# Patient Record
Sex: Female | Born: 1940 | Race: White | Hispanic: No | Smoking: Former smoker
Health system: Southern US, Community
[De-identification: ages and names within clinical notes are randomized; demographics above are authoritative.]

## PROBLEM LIST (undated history)

## (undated) DIAGNOSIS — K219 Gastro-esophageal reflux disease without esophagitis: Secondary | ICD-10-CM

## (undated) DIAGNOSIS — F419 Anxiety disorder, unspecified: Secondary | ICD-10-CM

## (undated) DIAGNOSIS — I10 Essential (primary) hypertension: Secondary | ICD-10-CM

## (undated) DIAGNOSIS — J45909 Unspecified asthma, uncomplicated: Secondary | ICD-10-CM

## (undated) DIAGNOSIS — R001 Bradycardia, unspecified: Secondary | ICD-10-CM

## (undated) DIAGNOSIS — I471 Supraventricular tachycardia: Secondary | ICD-10-CM

## (undated) DIAGNOSIS — J9601 Acute respiratory failure with hypoxia: Secondary | ICD-10-CM

## (undated) DIAGNOSIS — I6200 Nontraumatic subdural hemorrhage, unspecified: Secondary | ICD-10-CM

## (undated) DIAGNOSIS — I495 Sick sinus syndrome: Secondary | ICD-10-CM

## (undated) DIAGNOSIS — E7800 Pure hypercholesterolemia, unspecified: Secondary | ICD-10-CM

## (undated) DIAGNOSIS — R42 Dizziness and giddiness: Secondary | ICD-10-CM

## (undated) DIAGNOSIS — M1990 Unspecified osteoarthritis, unspecified site: Secondary | ICD-10-CM

## (undated) HISTORY — PX: PACEMAKER INSERTION: SHX728

## (undated) HISTORY — PX: CHOLECYSTECTOMY: SHX55

---

## 2020-12-11 ENCOUNTER — Emergency Department (HOSPITAL_BASED_OUTPATIENT_CLINIC_OR_DEPARTMENT_OTHER): Payer: Medicare HMO

## 2020-12-11 ENCOUNTER — Encounter (HOSPITAL_BASED_OUTPATIENT_CLINIC_OR_DEPARTMENT_OTHER): Payer: Self-pay | Admitting: Emergency Medicine

## 2020-12-11 ENCOUNTER — Other Ambulatory Visit: Payer: Self-pay

## 2020-12-11 ENCOUNTER — Emergency Department (HOSPITAL_BASED_OUTPATIENT_CLINIC_OR_DEPARTMENT_OTHER)
Admission: EM | Admit: 2020-12-11 | Discharge: 2020-12-11 | Disposition: A | Discharge status: Home / Self Care | Payer: Medicare HMO | Attending: Emergency Medicine | Admitting: Emergency Medicine

## 2020-12-11 DIAGNOSIS — S42212A Unspecified displaced fracture of surgical neck of left humerus, initial encounter for closed fracture: Secondary | ICD-10-CM | POA: Insufficient documentation

## 2020-12-11 DIAGNOSIS — W19XXXA Unspecified fall, initial encounter: Secondary | ICD-10-CM

## 2020-12-11 DIAGNOSIS — I10 Essential (primary) hypertension: Secondary | ICD-10-CM | POA: Insufficient documentation

## 2020-12-11 DIAGNOSIS — J45909 Unspecified asthma, uncomplicated: Secondary | ICD-10-CM | POA: Diagnosis not present

## 2020-12-11 DIAGNOSIS — S4992XA Unspecified injury of left shoulder and upper arm, initial encounter: Secondary | ICD-10-CM | POA: Diagnosis present

## 2020-12-11 DIAGNOSIS — W01198A Fall on same level from slipping, tripping and stumbling with subsequent striking against other object, initial encounter: Secondary | ICD-10-CM | POA: Insufficient documentation

## 2020-12-11 DIAGNOSIS — S42291A Other displaced fracture of upper end of right humerus, initial encounter for closed fracture: Secondary | ICD-10-CM

## 2020-12-11 DIAGNOSIS — Z950 Presence of cardiac pacemaker: Secondary | ICD-10-CM | POA: Insufficient documentation

## 2020-12-11 HISTORY — DX: Essential (primary) hypertension: I10

## 2020-12-11 HISTORY — DX: Unspecified asthma, uncomplicated: J45.909

## 2020-12-11 HISTORY — DX: Pure hypercholesterolemia, unspecified: E78.00

## 2020-12-11 MED ORDER — TRAMADOL HCL 50 MG PO TABS: 50.0000 mg | ORAL_TABLET | Freq: Four times a day (QID) | ORAL | 0 refills | Status: DC | PRN

## 2020-12-11 MED ORDER — TRAMADOL HCL 50 MG PO TABS
50.0000 mg | ORAL_TABLET | Freq: Once | ORAL | Status: AC
  Administered 2020-12-11: 50 mg via ORAL
  Filled 2020-12-11: qty 1

## 2020-12-11 NOTE — Discharge Instructions (Signed)
Your imaging today confirmed a right humerus head and neck fracture.  I spoke to the orthopedic team and they requested you stay in the sling, use the pain medicine, and follow-up with them in clinic this week.  Please call either the shoulder surgeons above to schedule an appointment.  Please rest.  If any symptoms change or worsen, please return to the nearest emergency department.

## 2020-12-11 NOTE — ED Provider Notes (Signed)
[facility] EMERGENCY DEPARTMENT Provider Note   CSN: [id number] Arrival date & time: 12/11/20  1341     History Chief Complaint  Patient presents with  . Fall    Holly Sherman is a 80 y.o. female.  The history is provided by the patient, medical records and a relative. No language interpreter was used.  Fall This is a new problem. The current episode started yesterday. The problem occurs rarely. The problem has not changed since onset.Pertinent negatives include no chest pain, no abdominal pain, no headaches and no shortness of breath.  Shoulder Injury This is a new problem. The current episode started yesterday. The problem has not changed since onset.Pertinent negatives include no chest pain, no abdominal pain, no headaches and no shortness of breath. The symptoms are aggravated by twisting (ranging shoulder). Nothing relieves the symptoms. She has tried nothing for the symptoms. The treatment provided no relief.       Past Medical History:  Diagnosis Date  . Asthma   . High cholesterol   . Hypertension     There are no problems to display for this patient.   Past Surgical History:  Procedure Laterality Date  . PACEMAKER INSERTION       OB History   No obstetric history on file.     No family history on file.  Social History   Tobacco Use  . Smoking status: Never Smoker  . Smokeless tobacco: Never Used    Home Medications Prior to Admission medications   Not on File    Allergies    Codeine  Review of Systems   Review of Systems  Constitutional: Negative for chills, diaphoresis, fatigue and fever.  HENT: Negative for congestion.   Respiratory: Negative for cough, chest tightness, shortness of breath and wheezing.   Cardiovascular: Negative for chest pain and palpitations.  Gastrointestinal: Negative for abdominal pain and nausea.  Genitourinary: Negative for flank pain.  Musculoskeletal: Negative for back pain, neck pain and neck  stiffness.  Neurological: Negative for syncope, weakness, light-headedness, numbness and headaches.  Psychiatric/Behavioral: Negative for agitation.  All other systems reviewed and are negative.   Physical Exam Updated Vital Signs BP (!) 175/108 (BP Location: Left Arm)   Pulse (!) 59   Temp 97.8 F (36.6 C) (Oral)   Resp 18   Ht 5\' 2"  (1.575 m)   Wt 71.2 kg   SpO2 98%   BMI 28.72 kg/m   Physical Exam Vitals and nursing note reviewed.  Constitutional:      General: She is not in acute distress.    Appearance: She is well-developed and well-nourished. She is not ill-appearing, toxic-appearing or diaphoretic.  HENT:     Head: Normocephalic and atraumatic.  Eyes:     Conjunctiva/sclera: Conjunctivae normal.     Pupils: Pupils are equal, round, and reactive to light.  Cardiovascular:     Rate and Rhythm: Normal rate and regular rhythm.     Heart sounds: No murmur heard.   Pulmonary:     Effort: Pulmonary effort is normal. No respiratory distress.     Breath sounds: Normal breath sounds. No wheezing, rhonchi or rales.  Chest:     Chest wall: No tenderness.  Abdominal:     General: Abdomen is flat.     Palpations: Abdomen is soft.     Tenderness: There is no abdominal tenderness.  Musculoskeletal:        General: Tenderness and signs of injury present. No edema.  Right shoulder: Tenderness and bony tenderness present. Decreased range of motion.     Cervical back: Neck supple. No tenderness.     Comments: Right shoulder range of motion limited due to severe pain.  Intact grip strength, sensation, pulses, appearance distal to the injury.  No tenderness or pain in the elbow or wrist  No tenderness on the chest wall or scapular area   Skin:    General: Skin is warm and dry.     Capillary Refill: Capillary refill takes less than 2 seconds.     Coloration: Skin is not pale.     Findings: No erythema.  Neurological:     General: No focal deficit present.     Mental  Status: She is alert.     Sensory: No sensory deficit.     Motor: No weakness.  Psychiatric:        Mood and Affect: Mood and affect normal.     ED Results / Procedures / Treatments   Labs (all labs ordered are listed, but only abnormal results are displayed) Labs Reviewed - No data to display  EKG None  Radiology DG Shoulder Right  Result Date: 12/11/2020 CLINICAL DATA:  Right shoulder pain after fall yesterday EXAM: RIGHT SHOULDER - 2+ VIEW COMPARISON:  None. FINDINGS: Severely comminuted fracture is seen involving the proximal right humeral head and neck. Ribs are unremarkable. IMPRESSION: Severely comminuted proximal right humeral head and neck fracture. Electronically Signed   By: Lupita Raider M.D.   On: 12/11/2020 14:22   CT Shoulder Right Wo Contrast  Result Date: 12/11/2020 CLINICAL DATA:  Humeral fracture, scapular fracture. Right shoulder pain. EXAM: CT OF THE UPPER RIGHT EXTREMITY WITHOUT CONTRAST TECHNIQUE: Multidetector CT imaging of the upper right extremity was performed according to the standard protocol. COMPARISON:  None. FINDINGS: Bones/Joint/Cartilage Comminuted, impacted fracture of the surgical neck of the right proximal humerus with 5 mm of lateral displacement and 9 mm of anterior displacement. Comminuted fracture of the greater tuberosity with the major fracture fragment displaced posteriorly by 10 mm. Fracture cleft extends into the lesser tuberosity. Nondisplaced fracture cleft involving the articular surface of the humeral head. No other fracture or dislocation. Large glenohumeral hemarthrosis. No aggressive osseous lesion. Partially visualized cervical spine degenerative disease with disc height loss. Ligaments Ligaments are suboptimally evaluated by CT. Muscles and Tendons Muscles are normal.  No muscle atrophy. Soft tissue No fluid collection or hematoma. No soft tissue mass. Visualized right lung is clear. Soft tissue edema around the glenohumeral joint. Soft  tissue edema in the subcutaneous fat around the shoulder. IMPRESSION: 1. Comminuted, impacted fracture of the surgical neck of the right proximal humerus with 5 mm of lateral displacement and 9 mm of anterior displacement. Comminuted fracture of the greater tuberosity with the major fracture fragment displaced posteriorly by 10 mm. Fracture cleft extends into the lesser tuberosity. Nondisplaced fracture cleft involving the articular surface of the humeral head. 2. Large glenohumeral hemarthrosis. Electronically Signed   By: Elige Ko   On: 12/11/2020 16:57    Procedures Procedures   Medications Ordered in ED Medications  traMADol (ULTRAM) tablet 50 mg (50 mg Oral Given 12/11/20 1540)    ED Course  I have reviewed the triage vital signs and the nursing notes.  Pertinent labs & imaging results that were available during my care of the patient were reviewed by me and considered in my medical decision making (see chart for details).    MDM Rules/Calculators/A&P  Consuello Underhill is a 80 y.o. female with a past medical history significant for pacemaker, hypertension, high cholesterol, asthma, and previous left shoulder fracture who presents with fall and right shoulder injury.  Patient reports that she was getting out of a car tod yesterday ay when she stepped on some mud and slipped and fell directly onto her right shoulder.  She reports that she tried to use some over-the-counter medications help the pain but it was significant.  She is concerned she broke her right shoulder similarly how she broke her left shoulder in the past.  She reports initially it was tingly immediately after the fall but has had no numbness, tingling, or weakness today.  She reports the pain is moderate to severe and worsened with any movement or manipulation.  She denies elbow pain or wrist pain.  She denies any pain in her chest, head, neck, back, or legs.  On exam, patient does have tenderness in  her right shoulder.  She has intact sensation in the deltoid area of her arm as well as the rest of the arm.  Normal sensation in her hand.  Normal grip strength.  Normal pulses.  Normal cap refill.  Patient has pain with any right shoulder manipulation.  Lungs clear and chest nontender.  Neck nontender, abdomen nontender.  Exam is unremarkable.  Patient had x-ray in triage which shows a comminuted humeral head and neck fracture on the right.  No evidence of rib fractures underlying on the image.  I called orthopedics who recommended CT scan, sling, pain medication, and follow-up in clinic this week to either Dr. Aundria Rud, Dr. Rennis Chris, or Dr. Devonne Doughty.  We will place the patient in a sling, give her prescription for tramadol which the family requested for her, and get her discharge for follow-up after imaging is collected.  Patient agrees with this plan and anticipate discharge after imaging  Imaging was performed and patient was placed in a sling.  Patient tolerated that well.  Patient given prescription for Ultram which she has tolerated and will be discharged to follow-up with the orthopedics team.  Patient discharged in good condition.   Final Clinical Impression(s) / ED Diagnoses Final diagnoses:  Closed fracture of head of right humerus, initial encounter  Fall, initial encounter    Rx / DC Orders ED Discharge Orders         Ordered    traMADol (ULTRAM) 50 MG tablet  Every 6 hours PRN        12/11/20 1653          Clinical Impression: 1. Closed fracture of head of right humerus, initial encounter   2. Fall, initial encounter     Disposition: Discharge  Condition: Good  I have discussed the results, Dx and Tx plan with the pt(& family if present). He/she/they expressed understanding and agree(s) with the plan. Discharge instructions discussed at great length. Strict return precautions discussed and pt &/or family have verbalized understanding of the instructions. No further  questions at time of discharge.    New Prescriptions   TRAMADOL (ULTRAM) 50 MG TABLET    Take 1 tablet (50 mg total) by mouth every 6 (six) hours as needed.    Follow Up: Yolonda Kida, MD [address] [place] [address] [place] [address] [phone]     Francena Hanly, MD [address] [place] [address] [place] [address] [phone]     Beverely Low, MD [address] [place] [address] [place] [address] [phone]  [facility] EMERGENCY DEPARTMENT [address] [id number] mc [address] [place] [address] [phone]       Floyde Dingley, Canary Brim, MD 12/11/20 1710

## 2020-12-11 NOTE — ED Triage Notes (Signed)
Pt slipped on mud yesterday landing on her R arm. C/o R shoulder pain. Denies LOC, neck or back pain.

## 2020-12-14 ENCOUNTER — Encounter (HOSPITAL_COMMUNITY): Payer: Self-pay | Admitting: Orthopedic Surgery

## 2020-12-14 ENCOUNTER — Other Ambulatory Visit: Payer: Self-pay

## 2020-12-14 NOTE — Progress Notes (Addendum)
COVID Vaccine Completed: No Date COVID Vaccine completed: COVID vaccine manufacturer: Pfizer    [employer]   Date of COVID positive in last 90 days: N/A.  Had Covid one year ago  PCP - Ardyth Gal, MD Cardiologist - Sandy Salaam, MD.  Sees in [facility]  Chest x-ray - 09-01-20 Care Everywhere EKG - 07-15-20 Care Everywhere.  Req 12-14-20 Stress Test -  ECHO - 05-14-20 Care Everywhere Cardiac Cath -  Pacemaker/ICD device last checked: 11-18-20 Care Everywhere Heart Monitor - 05-20-20 Care Everywhere  Sleep Study - N/A CPAP -   Fasting Blood Sugar - N/A Checks Blood Sugar _____ times a day  Blood Thinner Instructions:  N/A Aspirin Instructions: Last Dose:  Activity level:  Unable to go up a flight of stairs without symptoms of shortness of breath.   Has improved since pacemaker insertion.  Patient is able to do housework and still drives.       Anesthesia review:  Sick sinus syndrome with pacemaker placement, SVT, hx of respiratory failure  Patient has some shortness of breath with exertion but improved since pacemaker placement.  Denies fever, cough and chest pain.   Patient verbalized understanding of instructions that were given to them at the PAT appointment. Patient was also instructed that they will need to review over the PAT instructions again at home before surgery.

## 2020-12-14 NOTE — Progress Notes (Signed)
Please enter orders for surgery 12-16-20.

## 2020-12-15 NOTE — Progress Notes (Signed)
EKG 07/15/20 received placed in chart.

## 2020-12-15 NOTE — H&P (Signed)
Patient's anticipated LOS is less than 2 midnights, meeting these requirements: - Younger than 62 - Lives within 1 hour of care - Has a competent adult at home to recover with post-op recover - NO history of  - Chronic pain requiring opiods  - Diabetes  - Coronary Artery Disease  - Heart failure  - Heart attack  - Stroke  - DVT/VTE  - Cardiac arrhythmia  - Respiratory Failure/COPD  - Renal failure  - Anemia  - Advanced Liver disease       Violanda Dembeck is an 80 y.o. female.    Chief Complaint: right shoulder pain  HPI: Pt is a 80 y.o. female complaining of right shoulder pain due to recent fall. Pain had continually increased since the beginning. X-rays in the clinic show proximal humerus fracture with head split.  Various options are discussed with the patient. Risks, benefits and expectations were discussed with the patient. Patient understand the risks, benefits and expectations and wishes to proceed with surgery.   PCP:  System, Provider Not In  D/C Plans: Home  PMH: Past Medical History:  Diagnosis Date  . Acute respiratory failure with hypoxemia (HCC)   . Anxiety   . Arthritis   . Asthma   . Bradycardia   . GERD (gastroesophageal reflux disease)   . High cholesterol   . Hypertension   . Paroxysmal supraventricular tachycardia (HCC)   . Sick sinus syndrome (HCC)   . Subdural hemorrhage (HCC)   . Vertigo     PSH: Past Surgical History:  Procedure Laterality Date  . CHOLECYSTECTOMY    . PACEMAKER INSERTION      Social History:  reports that she has quit smoking. She has never used smokeless tobacco. She reports that she does not drink alcohol and does not use drugs.  Allergies:  Allergies  Allergen Reactions  . Atorvastatin Other (See Comments)    Muscle pain/ patient is taking  . Codeine Other (See Comments)    GI- upset  . Colesevelam Nausea And Vomiting  . Hydrochlorothiazide W-Amiloride   . Rosuvastatin Other (See Comments)    Other  reaction(s): MYALGIA,HIP WEAKNESS Myalgia, Hip Weakness.   . Moxifloxacin Rash    Medications: No current facility-administered medications for this encounter.   Current Outpatient Medications  Medication Sig Dispense Refill  . albuterol (VENTOLIN HFA) 108 (90 Base) MCG/ACT inhaler Inhale 2 puffs into the lungs every 6 (six) hours as needed for wheezing or shortness of breath.    Marland Kitchen amLODipine (NORVASC) 5 MG tablet Take 5 mg by mouth daily.    Marland Kitchen atorvastatin (LIPITOR) 20 MG tablet Take 20 mg by mouth at bedtime.    . cyclobenzaprine (FLEXERIL) 5 MG tablet Take 5 mg by mouth at bedtime as needed for muscle spasms.    . ergocalciferol (VITAMIN D2) 1.25 MG (50000 UT) capsule Take 50,000 Units by mouth once a week.    . escitalopram (LEXAPRO) 10 MG tablet Take 10 mg by mouth daily.    . furosemide (LASIX) 20 MG tablet Take 20 mg by mouth daily as needed for edema.    Marland Kitchen losartan (COZAAR) 100 MG tablet Take 100 mg by mouth daily.    . meclizine (ANTIVERT) 25 MG tablet Take 25 mg by mouth 3 (three) times daily as needed for dizziness.    . metoprolol succinate (TOPROL-XL) 25 MG 24 hr tablet Take 25 mg by mouth daily.    . montelukast (SINGULAIR) 10 MG tablet Take 10 mg by mouth daily  as needed (allergies).    . mupirocin ointment (BACTROBAN) 2 % Apply 1 application topically daily as needed (Rash).    Marland Kitchen omeprazole (PRILOSEC) 20 MG capsule Take 20 mg by mouth daily.    Bertram Gala Glycol-Propyl Glycol (SYSTANE) 0.4-0.3 % SOLN Place 1 drop into both eyes daily as needed (Dry eyes).    . polyethylene glycol (MIRALAX / GLYCOLAX) 17 g packet Take 17 g by mouth daily as needed.    . potassium chloride (KLOR-CON) 10 MEQ tablet Take 10 mEq by mouth See admin instructions.    . traMADol (ULTRAM) 50 MG tablet Take 1 tablet (50 mg total) by mouth every 6 (six) hours as needed. (Patient taking differently: Take 50 mg by mouth every 6 (six) hours as needed for severe pain or moderate pain.) 15 tablet 0     No results found for this or any previous visit (from the past 48 hour(s)). No results found.  ROS: Pain with rom of the right upper extremity  Physical Exam: Alert and oriented 80 y.o. female in no acute distress Cranial nerves 2-12 intact Cervical spine: full rom with no tenderness, nv intact distally Chest: active breath sounds bilaterally, no wheeze rhonchi or rales Heart: regular rate and rhythm, no murmur Abd: non tender non distended with active bowel sounds Hip is stable with rom  Right shoulder painful rom and early ecchymosis and edema nv intact distally No signs of open injury  Assessment/Plan Assessment: right proximal humerus fracture  Plan:  Patient will undergo a right reverse total shoulder by Dr. Ranell Patrick at [facility] Risks benefits and expectations were discussed with the patient. Patient understand risks, benefits and expectations and wishes to proceed. Preoperative templating of the joint replacement has been completed, documented, and submitted to the Operating Room personnel in order to optimize intra-operative equipment management.   Alphonsa Overall PA-C, MPAS [facility] is now [employer] [address], [place], [place] [address] Phone: [phone] [email or web address] Facebook  [employer]

## 2020-12-15 NOTE — Progress Notes (Signed)
Anesthesia Chart Review   Case: [id number] Date/Time: 12/16/20 1453   Procedure: REVERSE SHOULDER ARTHROPLASTY (Right Shoulder) - with interscalene block   Anesthesia type: General   Pre-op diagnosis: Right proximal humerus fracture   Location: [facility] ROOM 06 / [facility]   Surgeons: Beverely Low, MD      DISCUSSION:79 y.o. former smoker with h/o HTN, GERD, sick sinus syndrome s/p dual-chamber Biotronik pacemaker implant 09/01/2020, paroxysmal supraventricular tachycardia, right proximal humerus fracture scheduled for above procedure 12/16/20 with Dr. Beverely Low.   Pt last seen by cardiology 11/18/2020. Stable pacemaker interrogation. Toprol added due to some episodes of tachycardia. 7 month f/u recommended.   Device orders requested, pending.  VS: Ht 5\' 2"  (1.575 m)   Wt 71.2 kg   BMI 28.72 kg/m   PROVIDERS: System, Provider Not In  , MD is PCP  Tessie Eke, MD is Cardiologist  LABS: labs DOS, SDW (all labs ordered are listed, but only abnormal results are displayed)  Labs Reviewed - No data to display   IMAGES:   EKG: On chart   CV: Echo 05/14/2020  SUMMARY  The left ventricle is hyperdynamic.  LV ejection fraction = >70%.  No segmental wall motion abnormalities seen in the left ventricle  Left ventricular filling pattern is indeterminate.  The right ventricular systolic function is normal.  There is no evidence of mitral valve prolapse.  There is no significantvalvular stenosis or regurgitation.  There is no comparison study available.   Past Medical History:  Diagnosis Date  . Acute respiratory failure with hypoxemia (HCC)   . Anxiety   . Arthritis   . Asthma   . Bradycardia   . GERD (gastroesophageal reflux disease)   . High cholesterol   . Hypertension   . Paroxysmal supraventricular tachycardia (HCC)   . Sick sinus syndrome (HCC)   . Subdural hemorrhage (HCC)   . Vertigo     Past Surgical History:  Procedure Laterality Date  .  CHOLECYSTECTOMY    . PACEMAKER INSERTION      MEDICATIONS: No current facility-administered medications for this encounter.   07/15/2020 albuterol (VENTOLIN HFA) 108 (90 Base) MCG/ACT inhaler  . amLODipine (NORVASC) 5 MG tablet  . atorvastatin (LIPITOR) 20 MG tablet  . cyclobenzaprine (FLEXERIL) 5 MG tablet  . ergocalciferol (VITAMIN D2) 1.25 MG (50000 UT) capsule  . escitalopram (LEXAPRO) 10 MG tablet  . furosemide (LASIX) 20 MG tablet  . losartan (COZAAR) 100 MG tablet  . meclizine (ANTIVERT) 25 MG tablet  . metoprolol succinate (TOPROL-XL) 25 MG 24 hr tablet  . montelukast (SINGULAIR) 10 MG tablet  . mupirocin ointment (BACTROBAN) 2 %  . omeprazole (PRILOSEC) 20 MG capsule  . Polyethyl Glycol-Propyl Glycol (SYSTANE) 0.4-0.3 % SOLN  . polyethylene glycol (MIRALAX / GLYCOLAX) 17 g packet  . potassium chloride (KLOR-CON) 10 MEQ tablet  . traMADol (ULTRAM) 50 MG tablet    Marland Kitchen, PA-C [facility] [phone]

## 2020-12-16 ENCOUNTER — Inpatient Hospital Stay (HOSPITAL_COMMUNITY)
Admission: RE | Admit: 2020-12-16 | Discharge: 2020-12-19 | DRG: 483 | Disposition: A | Discharge status: Home / Self Care | Payer: Medicare HMO | Attending: Orthopedic Surgery | Admitting: Orthopedic Surgery

## 2020-12-16 ENCOUNTER — Encounter (HOSPITAL_COMMUNITY): Payer: Self-pay | Admitting: Orthopedic Surgery

## 2020-12-16 ENCOUNTER — Encounter (HOSPITAL_COMMUNITY)
Admission: RE | Disposition: A | Discharge status: Home / Self Care | Payer: Self-pay | Source: Home / Self Care | Attending: Orthopedic Surgery

## 2020-12-16 ENCOUNTER — Inpatient Hospital Stay (HOSPITAL_COMMUNITY): Payer: Medicare HMO

## 2020-12-16 ENCOUNTER — Other Ambulatory Visit: Payer: Self-pay

## 2020-12-16 ENCOUNTER — Ambulatory Visit (HOSPITAL_COMMUNITY): Payer: Medicare HMO | Admitting: Physician Assistant

## 2020-12-16 DIAGNOSIS — W19XXXA Unspecified fall, initial encounter: Secondary | ICD-10-CM | POA: Diagnosis present

## 2020-12-16 DIAGNOSIS — Z87891 Personal history of nicotine dependence: Secondary | ICD-10-CM

## 2020-12-16 DIAGNOSIS — I471 Supraventricular tachycardia: Secondary | ICD-10-CM | POA: Diagnosis present

## 2020-12-16 DIAGNOSIS — Z950 Presence of cardiac pacemaker: Secondary | ICD-10-CM

## 2020-12-16 DIAGNOSIS — Z79899 Other long term (current) drug therapy: Secondary | ICD-10-CM

## 2020-12-16 DIAGNOSIS — Z20822 Contact with and (suspected) exposure to covid-19: Secondary | ICD-10-CM | POA: Diagnosis present

## 2020-12-16 DIAGNOSIS — S42291A Other displaced fracture of upper end of right humerus, initial encounter for closed fracture: Principal | ICD-10-CM | POA: Diagnosis present

## 2020-12-16 DIAGNOSIS — E7800 Pure hypercholesterolemia, unspecified: Secondary | ICD-10-CM | POA: Diagnosis present

## 2020-12-16 DIAGNOSIS — I495 Sick sinus syndrome: Secondary | ICD-10-CM | POA: Diagnosis present

## 2020-12-16 DIAGNOSIS — I10 Essential (primary) hypertension: Secondary | ICD-10-CM | POA: Diagnosis present

## 2020-12-16 DIAGNOSIS — Z888 Allergy status to other drugs, medicaments and biological substances status: Secondary | ICD-10-CM

## 2020-12-16 DIAGNOSIS — Z96611 Presence of right artificial shoulder joint: Secondary | ICD-10-CM

## 2020-12-16 DIAGNOSIS — R0602 Shortness of breath: Secondary | ICD-10-CM

## 2020-12-16 DIAGNOSIS — Z885 Allergy status to narcotic agent status: Secondary | ICD-10-CM

## 2020-12-16 DIAGNOSIS — K219 Gastro-esophageal reflux disease without esophagitis: Secondary | ICD-10-CM | POA: Diagnosis present

## 2020-12-16 DIAGNOSIS — Z881 Allergy status to other antibiotic agents status: Secondary | ICD-10-CM

## 2020-12-16 DIAGNOSIS — J45909 Unspecified asthma, uncomplicated: Secondary | ICD-10-CM | POA: Diagnosis present

## 2020-12-16 HISTORY — DX: Sick sinus syndrome: I49.5

## 2020-12-16 HISTORY — DX: Nontraumatic subdural hemorrhage, unspecified: I62.00

## 2020-12-16 HISTORY — DX: Dizziness and giddiness: R42

## 2020-12-16 HISTORY — DX: Supraventricular tachycardia: I47.1

## 2020-12-16 HISTORY — DX: Bradycardia, unspecified: R00.1

## 2020-12-16 HISTORY — DX: Acute respiratory failure with hypoxia: J96.01

## 2020-12-16 HISTORY — PX: REVERSE SHOULDER ARTHROPLASTY: SHX5054

## 2020-12-16 HISTORY — DX: Anxiety disorder, unspecified: F41.9

## 2020-12-16 HISTORY — DX: Gastro-esophageal reflux disease without esophagitis: K21.9

## 2020-12-16 HISTORY — DX: Unspecified osteoarthritis, unspecified site: M19.90

## 2020-12-16 LAB — CBC
HCT: 32.9 % — ABNORMAL LOW (ref 36.0–46.0)
Hemoglobin: 10.5 g/dL — ABNORMAL LOW (ref 12.0–15.0)
MCH: 31.9 pg (ref 26.0–34.0)
MCHC: 31.9 g/dL (ref 30.0–36.0)
MCV: 100 fL (ref 80.0–100.0)
Platelets: 275 10*3/uL (ref 150–400)
RBC: 3.29 MIL/uL — ABNORMAL LOW (ref 3.87–5.11)
RDW: 12.2 % (ref 11.5–15.5)
WBC: 7.3 10*3/uL (ref 4.0–10.5)
nRBC: 0 % (ref 0.0–0.2)

## 2020-12-16 LAB — SURGICAL PCR SCREEN
MRSA, PCR: NEGATIVE
Staphylococcus aureus: NEGATIVE

## 2020-12-16 LAB — BASIC METABOLIC PANEL
Anion gap: 12 (ref 5–15)
BUN: 9 mg/dL (ref 8–23)
CO2: 25 mmol/L (ref 22–32)
Calcium: 8.6 mg/dL — ABNORMAL LOW (ref 8.9–10.3)
Chloride: 101 mmol/L (ref 98–111)
Creatinine, Ser: 0.65 mg/dL (ref 0.44–1.00)
GFR, Estimated: >60 mL/min (ref >60)
Glucose, Bld: 117 mg/dL — ABNORMAL HIGH (ref 70–99)
Potassium: 3.9 mmol/L (ref 3.5–5.1)
Sodium: 138 mmol/L (ref 135–145)

## 2020-12-16 LAB — SARS CORONAVIRUS 2 BY RT PCR (HOSPITAL ORDER, PERFORMED IN ~~LOC~~ HOSPITAL LAB): SARS Coronavirus 2: NEGATIVE

## 2020-12-16 SURGERY — ARTHROPLASTY, SHOULDER, TOTAL, REVERSE
Anesthesia: General | Site: Shoulder | Laterality: Right

## 2020-12-16 MED ORDER — FENTANYL CITRATE (PF) 100 MCG/2ML IJ SOLN: 25.0000 ug | INTRAMUSCULAR | Status: DC | PRN

## 2020-12-16 MED ORDER — LIDOCAINE 2% (20 MG/ML) 5 ML SYRINGE
INTRAMUSCULAR | Status: DC | PRN
  Administered 2020-12-16: 80 mg via INTRAVENOUS

## 2020-12-16 MED ORDER — ESCITALOPRAM OXALATE 10 MG PO TABS
10.0000 mg | ORAL_TABLET | Freq: Every day | ORAL | Status: DC
  Administered 2020-12-16 – 2020-12-19 (×4): 10 mg via ORAL
  Filled 2020-12-16 (×4): qty 1

## 2020-12-16 MED ORDER — POTASSIUM CHLORIDE ER 10 MEQ PO TBCR
10.0000 meq | EXTENDED_RELEASE_TABLET | Freq: Every day | ORAL | Status: DC | PRN
  Filled 2020-12-16: qty 1

## 2020-12-16 MED ORDER — BUPIVACAINE HCL (PF) 0.5 % IJ SOLN
INTRAMUSCULAR | Status: DC | PRN
  Administered 2020-12-16: 10 mL via PERINEURAL

## 2020-12-16 MED ORDER — PROPOFOL 10 MG/ML IV BOLUS
INTRAVENOUS | Status: DC | PRN
  Administered 2020-12-16: 120 mg via INTRAVENOUS

## 2020-12-16 MED ORDER — POLYETHYLENE GLYCOL 3350 17 G PO PACK: 17.0000 g | PACK | Freq: Every day | ORAL | Status: DC | PRN

## 2020-12-16 MED ORDER — LACTATED RINGERS IV SOLN: INTRAVENOUS | Status: DC | PRN

## 2020-12-16 MED ORDER — BUPIVACAINE-EPINEPHRINE (PF) 0.25% -1:200000 IJ SOLN
INTRAMUSCULAR | Status: AC
  Filled 2020-12-16: qty 30

## 2020-12-16 MED ORDER — VASOPRESSIN 20 UNIT/ML IV SOLN
INTRAVENOUS | Status: AC
  Filled 2020-12-16: qty 1

## 2020-12-16 MED ORDER — EPHEDRINE 5 MG/ML INJ
INTRAVENOUS | Status: AC
  Filled 2020-12-16: qty 20

## 2020-12-16 MED ORDER — ONDANSETRON HCL 4 MG PO TABS: 4.0000 mg | ORAL_TABLET | Freq: Four times a day (QID) | ORAL | Status: DC | PRN

## 2020-12-16 MED ORDER — ALBUTEROL SULFATE (2.5 MG/3ML) 0.083% IN NEBU
2.5000 mg | INHALATION_SOLUTION | Freq: Once | RESPIRATORY_TRACT | Status: AC
  Administered 2020-12-16: 2.5 mg via RESPIRATORY_TRACT

## 2020-12-16 MED ORDER — FENTANYL CITRATE (PF) 100 MCG/2ML IJ SOLN
50.0000 ug | INTRAMUSCULAR | Status: AC
  Administered 2020-12-16: 50 ug via INTRAVENOUS
  Filled 2020-12-16: qty 2

## 2020-12-16 MED ORDER — PHENYLEPHRINE HCL (PRESSORS) 10 MG/ML IV SOLN
INTRAVENOUS | Status: AC
  Filled 2020-12-16: qty 1

## 2020-12-16 MED ORDER — TRANEXAMIC ACID-NACL 1000-0.7 MG/100ML-% IV SOLN
INTRAVENOUS | Status: AC
  Filled 2020-12-16: qty 100

## 2020-12-16 MED ORDER — MECLIZINE HCL 25 MG PO TABS: 25.0000 mg | ORAL_TABLET | Freq: Three times a day (TID) | ORAL | Status: DC | PRN

## 2020-12-16 MED ORDER — SODIUM CHLORIDE (PF) 0.9 % IJ SOLN
INTRAMUSCULAR | Status: AC
  Filled 2020-12-16: qty 30

## 2020-12-16 MED ORDER — ORAL CARE MOUTH RINSE
15.0000 mL | Freq: Once | OROMUCOSAL | Status: AC
  Administered 2020-12-16: 15 mL via OROMUCOSAL

## 2020-12-16 MED ORDER — LIDOCAINE HCL (PF) 2 % IJ SOLN
INTRAMUSCULAR | Status: AC
  Filled 2020-12-16: qty 5

## 2020-12-16 MED ORDER — MORPHINE SULFATE (PF) 2 MG/ML IV SOLN: 0.5000 mg | INTRAVENOUS | Status: DC | PRN

## 2020-12-16 MED ORDER — ALBUMIN HUMAN 5 % IV SOLN: INTRAVENOUS | Status: DC | PRN

## 2020-12-16 MED ORDER — PROPOFOL 10 MG/ML IV BOLUS
INTRAVENOUS | Status: AC
  Filled 2020-12-16: qty 20

## 2020-12-16 MED ORDER — SODIUM CHLORIDE 0.9 % IV SOLN: INTRAVENOUS | Status: DC

## 2020-12-16 MED ORDER — PANTOPRAZOLE SODIUM 40 MG PO TBEC
40.0000 mg | DELAYED_RELEASE_TABLET | Freq: Every day | ORAL | Status: DC
  Administered 2020-12-16 – 2020-12-19 (×4): 40 mg via ORAL
  Filled 2020-12-16 (×4): qty 1

## 2020-12-16 MED ORDER — VASOPRESSIN 20 UNIT/ML IV SOLN
INTRAVENOUS | Status: DC | PRN
  Administered 2020-12-16 (×3): 1 [IU] via INTRAVENOUS

## 2020-12-16 MED ORDER — METOCLOPRAMIDE HCL 5 MG/ML IJ SOLN
5.0000 mg | Freq: Three times a day (TID) | INTRAMUSCULAR | Status: DC | PRN
Start: 2020-12-16 — End: 2020-12-19

## 2020-12-16 MED ORDER — BUPIVACAINE-EPINEPHRINE (PF) 0.25% -1:200000 IJ SOLN
INTRAMUSCULAR | Status: DC | PRN
  Administered 2020-12-16: 10 mL

## 2020-12-16 MED ORDER — LACTATED RINGERS IV SOLN: INTRAVENOUS | Status: DC

## 2020-12-16 MED ORDER — FENTANYL CITRATE (PF) 100 MCG/2ML IJ SOLN
INTRAMUSCULAR | Status: AC
  Filled 2020-12-16: qty 2

## 2020-12-16 MED ORDER — MUPIROCIN 2 % EX OINT: 1.0000 "application " | TOPICAL_OINTMENT | Freq: Every day | CUTANEOUS | Status: DC | PRN

## 2020-12-16 MED ORDER — PHENOL 1.4 % MT LIQD: 1.0000 | OROMUCOSAL | Status: DC | PRN

## 2020-12-16 MED ORDER — TRANEXAMIC ACID-NACL 1000-0.7 MG/100ML-% IV SOLN
INTRAVENOUS | Status: DC | PRN
  Administered 2020-12-16: 1000 mg via INTRAVENOUS

## 2020-12-16 MED ORDER — DEXAMETHASONE SODIUM PHOSPHATE 10 MG/ML IJ SOLN
INTRAMUSCULAR | Status: AC
  Filled 2020-12-16: qty 1

## 2020-12-16 MED ORDER — DEXAMETHASONE SODIUM PHOSPHATE 10 MG/ML IJ SOLN
INTRAMUSCULAR | Status: DC | PRN
  Administered 2020-12-16: 4 mg via INTRAVENOUS

## 2020-12-16 MED ORDER — TRANEXAMIC ACID-NACL 1000-0.7 MG/100ML-% IV SOLN
1000.0000 mg | Freq: Once | INTRAVENOUS | Status: AC
  Administered 2020-12-16: 1000 mg via INTRAVENOUS
  Filled 2020-12-16: qty 100

## 2020-12-16 MED ORDER — ATORVASTATIN CALCIUM 20 MG PO TABS
20.0000 mg | ORAL_TABLET | Freq: Every day | ORAL | Status: DC
  Administered 2020-12-16 – 2020-12-18 (×3): 20 mg via ORAL
  Filled 2020-12-16 (×3): qty 1

## 2020-12-16 MED ORDER — SUCCINYLCHOLINE CHLORIDE 200 MG/10ML IV SOSY
PREFILLED_SYRINGE | INTRAVENOUS | Status: DC | PRN
  Administered 2020-12-16: 140 mg via INTRAVENOUS

## 2020-12-16 MED ORDER — ONDANSETRON HCL 4 MG/2ML IJ SOLN
INTRAMUSCULAR | Status: AC
  Filled 2020-12-16: qty 2

## 2020-12-16 MED ORDER — ALBUMIN HUMAN 5 % IV SOLN
INTRAVENOUS | Status: AC
  Filled 2020-12-16: qty 500

## 2020-12-16 MED ORDER — METOCLOPRAMIDE HCL 5 MG PO TABS: 5.0000 mg | ORAL_TABLET | Freq: Three times a day (TID) | ORAL | Status: DC | PRN

## 2020-12-16 MED ORDER — LOSARTAN POTASSIUM 50 MG PO TABS
100.0000 mg | ORAL_TABLET | Freq: Every day | ORAL | Status: DC
  Administered 2020-12-17 – 2020-12-19 (×3): 100 mg via ORAL
  Filled 2020-12-16 (×4): qty 2

## 2020-12-16 MED ORDER — FUROSEMIDE 20 MG PO TABS: 20.0000 mg | ORAL_TABLET | Freq: Every day | ORAL | Status: DC | PRN

## 2020-12-16 MED ORDER — PHENYLEPHRINE 40 MCG/ML (10ML) SYRINGE FOR IV PUSH (FOR BLOOD PRESSURE SUPPORT)
PREFILLED_SYRINGE | INTRAVENOUS | Status: AC
  Filled 2020-12-16: qty 10

## 2020-12-16 MED ORDER — POLYVINYL ALCOHOL 1.4 % OP SOLN
1.0000 [drp] | OPHTHALMIC | Status: DC | PRN
  Filled 2020-12-16: qty 15

## 2020-12-16 MED ORDER — CEFAZOLIN SODIUM-DEXTROSE 2-4 GM/100ML-% IV SOLN
2.0000 g | INTRAVENOUS | Status: AC
  Administered 2020-12-16: 2 g via INTRAVENOUS
  Filled 2020-12-16: qty 100

## 2020-12-16 MED ORDER — ALBUTEROL SULFATE (2.5 MG/3ML) 0.083% IN NEBU
INHALATION_SOLUTION | RESPIRATORY_TRACT | Status: AC
  Filled 2020-12-16: qty 3

## 2020-12-16 MED ORDER — ACETAMINOPHEN 500 MG PO TABS
1000.0000 mg | ORAL_TABLET | Freq: Once | ORAL | Status: AC
  Administered 2020-12-16: 1000 mg via ORAL
  Filled 2020-12-16: qty 2

## 2020-12-16 MED ORDER — CHLORHEXIDINE GLUCONATE 0.12 % MT SOLN: 15.0000 mL | Freq: Once | OROMUCOSAL | Status: AC

## 2020-12-16 MED ORDER — PHENYLEPHRINE HCL-NACL 10-0.9 MG/250ML-% IV SOLN
INTRAVENOUS | Status: DC | PRN
  Administered 2020-12-16: 20 ug/min via INTRAVENOUS

## 2020-12-16 MED ORDER — ACETAMINOPHEN 325 MG PO TABS
325.0000 mg | ORAL_TABLET | Freq: Four times a day (QID) | ORAL | Status: DC | PRN
Start: 2020-12-17 — End: 2020-12-19
  Administered 2020-12-17 – 2020-12-18 (×2): 650 mg via ORAL
  Filled 2020-12-16 (×2): qty 2

## 2020-12-16 MED ORDER — STERILE WATER FOR IRRIGATION IR SOLN
Status: DC | PRN
  Administered 2020-12-16: 2000 mL

## 2020-12-16 MED ORDER — CEFAZOLIN SODIUM-DEXTROSE 2-4 GM/100ML-% IV SOLN
2.0000 g | Freq: Four times a day (QID) | INTRAVENOUS | Status: AC
  Administered 2020-12-16 – 2020-12-17 (×3): 2 g via INTRAVENOUS
  Filled 2020-12-16 (×3): qty 100

## 2020-12-16 MED ORDER — MONTELUKAST SODIUM 10 MG PO TABS
10.0000 mg | ORAL_TABLET | Freq: Every day | ORAL | Status: DC | PRN
Start: 2020-12-16 — End: 2020-12-19

## 2020-12-16 MED ORDER — ONDANSETRON HCL 4 MG/2ML IJ SOLN
INTRAMUSCULAR | Status: DC | PRN
  Administered 2020-12-16: 4 mg via INTRAVENOUS

## 2020-12-16 MED ORDER — MENTHOL 3 MG MT LOZG: 1.0000 | LOZENGE | OROMUCOSAL | Status: DC | PRN

## 2020-12-16 MED ORDER — SUCCINYLCHOLINE CHLORIDE 200 MG/10ML IV SOSY
PREFILLED_SYRINGE | INTRAVENOUS | Status: AC
  Filled 2020-12-16: qty 10

## 2020-12-16 MED ORDER — PHENYLEPHRINE 40 MCG/ML (10ML) SYRINGE FOR IV PUSH (FOR BLOOD PRESSURE SUPPORT)
PREFILLED_SYRINGE | INTRAVENOUS | Status: DC | PRN
  Administered 2020-12-16 (×4): 80 ug via INTRAVENOUS

## 2020-12-16 MED ORDER — POLYETHYL GLYCOL-PROPYL GLYCOL 0.4-0.3 % OP SOLN: 1.0000 [drp] | Freq: Every day | OPHTHALMIC | Status: DC | PRN

## 2020-12-16 MED ORDER — TRAMADOL HCL 50 MG PO TABS: 50.0000 mg | ORAL_TABLET | Freq: Four times a day (QID) | ORAL | 0 refills | Status: AC | PRN

## 2020-12-16 MED ORDER — ONDANSETRON HCL 4 MG/2ML IJ SOLN: 4.0000 mg | Freq: Four times a day (QID) | INTRAMUSCULAR | Status: DC | PRN

## 2020-12-16 MED ORDER — TRAMADOL HCL 50 MG PO TABS
50.0000 mg | ORAL_TABLET | Freq: Four times a day (QID) | ORAL | Status: DC | PRN
  Filled 2020-12-16: qty 1

## 2020-12-16 MED ORDER — CYCLOBENZAPRINE HCL 5 MG PO TABS
5.0000 mg | ORAL_TABLET | Freq: Every evening | ORAL | Status: DC | PRN
  Administered 2020-12-16: 5 mg via ORAL
  Filled 2020-12-16: qty 1

## 2020-12-16 MED ORDER — 0.9 % SODIUM CHLORIDE (POUR BTL) OPTIME
TOPICAL | Status: DC | PRN
  Administered 2020-12-16: 1000 mL

## 2020-12-16 MED ORDER — ALBUTEROL SULFATE HFA 108 (90 BASE) MCG/ACT IN AERS
2.0000 | INHALATION_SPRAY | Freq: Four times a day (QID) | RESPIRATORY_TRACT | Status: DC | PRN
  Filled 2020-12-16: qty 6.7

## 2020-12-16 MED ORDER — ONDANSETRON HCL 4 MG/2ML IJ SOLN: 4.0000 mg | Freq: Once | INTRAMUSCULAR | Status: DC | PRN

## 2020-12-16 MED ORDER — VITAMIN D (ERGOCALCIFEROL) 1.25 MG (50000 UNIT) PO CAPS: 50000.0000 [IU] | ORAL_CAPSULE | ORAL | Status: DC

## 2020-12-16 MED ORDER — AMLODIPINE BESYLATE 5 MG PO TABS
5.0000 mg | ORAL_TABLET | Freq: Every day | ORAL | Status: DC
  Administered 2020-12-16 – 2020-12-19 (×4): 5 mg via ORAL
  Filled 2020-12-16 (×4): qty 1

## 2020-12-16 MED ORDER — BUPIVACAINE LIPOSOME 1.3 % IJ SUSP
INTRAMUSCULAR | Status: DC | PRN
  Administered 2020-12-16: 10 mL via PERINEURAL

## 2020-12-16 MED ORDER — METOPROLOL SUCCINATE ER 25 MG PO TB24
25.0000 mg | ORAL_TABLET | Freq: Every day | ORAL | Status: DC
  Administered 2020-12-16 – 2020-12-19 (×4): 25 mg via ORAL
  Filled 2020-12-16 (×4): qty 1

## 2020-12-16 MED ORDER — DOCUSATE SODIUM 100 MG PO CAPS
100.0000 mg | ORAL_CAPSULE | Freq: Two times a day (BID) | ORAL | Status: DC
  Administered 2020-12-16 – 2020-12-19 (×6): 100 mg via ORAL
  Filled 2020-12-16 (×6): qty 1

## 2020-12-16 SURGICAL SUPPLY — 74 items
BAG ZIPLOCK 12X15 (MISCELLANEOUS) IMPLANT
BIT DRILL 1.6MX128 (BIT) IMPLANT
BIT DRILL 1.6MX128MM (BIT)
BIT DRILL 170X2.5X (BIT) ×1 IMPLANT
BIT DRL 170X2.5X (BIT) ×1
BLADE SAG 18X100X1.27 (BLADE) ×3 IMPLANT
BOWL SMART MIX CTS (DISPOSABLE) ×3 IMPLANT
CEMENT HV SMART SET (Cement) ×3 IMPLANT
CLOSURE WOUND 1/2 X4 (GAUZE/BANDAGES/DRESSINGS) ×1
COVER BACK TABLE 60X90IN (DRAPES) ×3 IMPLANT
COVER SURGICAL LIGHT HANDLE (MISCELLANEOUS) ×3 IMPLANT
COVER WAND RF STERILE (DRAPES) IMPLANT
CUP D38 DXTEND STAND PLUS 6 HU (Orthopedic Implant) ×3 IMPLANT
CUP STD D38 DXTEND PLUS 6 HU (Orthopedic Implant) ×1 IMPLANT
DECANTER SPIKE VIAL GLASS SM (MISCELLANEOUS) ×3 IMPLANT
DRAPE INCISE IOBAN 66X45 STRL (DRAPES) ×3 IMPLANT
DRAPE ORTHO SPLIT 77X108 STRL (DRAPES) ×4
DRAPE SHEET LG 3/4 BI-LAMINATE (DRAPES) ×3 IMPLANT
DRAPE SURG ORHT 6 SPLT 77X108 (DRAPES) ×2 IMPLANT
DRAPE TOP 10253 STERILE (DRAPES) ×3 IMPLANT
DRAPE U-SHAPE 47X51 STRL (DRAPES) ×3 IMPLANT
DRILL 2.5 (BIT) ×2
DRSG ADAPTIC 3X8 NADH LF (GAUZE/BANDAGES/DRESSINGS) ×3 IMPLANT
DRSG PAD ABDOMINAL 8X10 ST (GAUZE/BANDAGES/DRESSINGS) ×3 IMPLANT
DURAPREP 26ML APPLICATOR (WOUND CARE) ×3 IMPLANT
ELECT BLADE TIP CTD 4 INCH (ELECTRODE) ×3 IMPLANT
ELECT NEEDLE TIP 2.8 STRL (NEEDLE) ×3 IMPLANT
ELECT REM PT RETURN 15FT ADLT (MISCELLANEOUS) ×3 IMPLANT
FACESHIELD WRAPAROUND (MASK) ×3 IMPLANT
GAUZE SPONGE 4X4 12PLY STRL (GAUZE/BANDAGES/DRESSINGS) ×3 IMPLANT
GLENOSPHERE DELTA XTEND LAT 38 (Miscellaneous) ×3 IMPLANT
GLOVE BIOGEL PI ORTHO PRO 7.5 (GLOVE) ×2
GLOVE BIOGEL PI ORTHO PRO SZ8 (GLOVE) ×2
GLOVE ORTHO TXT STRL SZ7.5 (GLOVE) ×3 IMPLANT
GLOVE PI ORTHO PRO STRL 7.5 (GLOVE) ×1 IMPLANT
GLOVE PI ORTHO PRO STRL SZ8 (GLOVE) ×1 IMPLANT
GLOVE SURG ORTHO LTX SZ8.5 (GLOVE) ×3 IMPLANT
GOWN STRL REUS W/TWL XL LVL3 (GOWN DISPOSABLE) ×6 IMPLANT
KIT BASIN OR (CUSTOM PROCEDURE TRAY) ×3 IMPLANT
KIT TURNOVER KIT A (KITS) ×3 IMPLANT
MANIFOLD NEPTUNE II (INSTRUMENTS) ×3 IMPLANT
METAGLENE DELTA EXTEND (Trauma) ×1 IMPLANT
METAGLENE DXTEND (Trauma) ×3 IMPLANT
NEEDLE MAYO CATGUT SZ4 (NEEDLE) ×3 IMPLANT
NS IRRIG 1000ML POUR BTL (IV SOLUTION) ×3 IMPLANT
PACK SHOULDER (CUSTOM PROCEDURE TRAY) ×3 IMPLANT
PENCIL SMOKE EVACUATOR (MISCELLANEOUS) IMPLANT
PIN GUIDE 1.2 (PIN) ×3 IMPLANT
PIN GUIDE GLENOPHERE 1.5MX300M (PIN) ×3 IMPLANT
PIN METAGLENE 2.5 (PIN) ×3 IMPLANT
PROTECTOR NERVE ULNAR (MISCELLANEOUS) ×3 IMPLANT
RESTRAINT HEAD UNIVERSAL NS (MISCELLANEOUS) ×3 IMPLANT
SCREW LOCK 42 (Screw) ×3 IMPLANT
SCREW LOCK DELTA XTEND 4.5X30 (Screw) ×3 IMPLANT
SLING ARM FOAM STRAP LRG (SOFTGOODS) ×3 IMPLANT
SMARTMIX MINI TOWER (MISCELLANEOUS)
SPONGE LAP 4X18 RFD (DISPOSABLE) IMPLANT
STAPLER VISISTAT 35W (STAPLE) ×3 IMPLANT
STEM HUM PC SZ1 RT (Stem) ×3 IMPLANT
STEM HUMERAL SZ8 STANDARD (Stem) ×3 IMPLANT
STEM HUMERAL SZ8 STD (Stem) ×1 IMPLANT
STRIP CLOSURE SKIN 1/2X4 (GAUZE/BANDAGES/DRESSINGS) ×2 IMPLANT
SUCTION FRAZIER HANDLE 10FR (MISCELLANEOUS) ×2
SUCTION TUBE FRAZIER 10FR DISP (MISCELLANEOUS) ×1 IMPLANT
SUT FIBERWIRE #2 38 T-5 BLUE (SUTURE) ×15
SUT MNCRL AB 4-0 PS2 18 (SUTURE) ×3 IMPLANT
SUT VIC AB 0 CT1 36 (SUTURE) ×6 IMPLANT
SUT VIC AB 0 CT2 27 (SUTURE) ×3 IMPLANT
SUT VIC AB 2-0 CT1 27 (SUTURE) ×2
SUT VIC AB 2-0 CT1 TAPERPNT 27 (SUTURE) ×1 IMPLANT
SUTURE FIBERWR #2 38 T-5 BLUE (SUTURE) ×5 IMPLANT
TOWEL OR 17X26 10 PK STRL BLUE (TOWEL DISPOSABLE) ×3 IMPLANT
TOWER SMARTMIX MINI (MISCELLANEOUS) IMPLANT
YANKAUER SUCT BULB TIP 10FT TU (MISCELLANEOUS) ×3 IMPLANT

## 2020-12-16 NOTE — Progress Notes (Signed)
Device orders received, taken to short stay to be placed in chart.

## 2020-12-16 NOTE — Brief Op Note (Signed)
12/16/2020  5:36 PM  PATIENT:  Holly Sherman  80 y.o. female  PRE-OPERATIVE DIAGNOSIS:  Right proximal humerus fracture/dislocation  POST-OPERATIVE DIAGNOSIS:  Right proximal humerus fracture/dislocation   PROCEDURE:  Procedure(s) with comments: REVERSE SHOULDER ARTHROPLASTY (Right) - with interscalene block DePuy Delta Xtend with tuberosity repair  SURGEON:  Surgeon(s) and Role:    Beverely Low, MD - Primary  PHYSICIAN ASSISTANT:   ASSISTANTS: Thea Gist, PA-C   ANESTHESIA:   regional and general  EBL:  200 mL   BLOOD ADMINISTERED:none, 2 units of albumin given  DRAINS: none   LOCAL MEDICATIONS USED:  MARCAINE     SPECIMEN:  No Specimen  DISPOSITION OF SPECIMEN:  N/A  COUNTS:  YES  TOURNIQUET:  * No tourniquets in log *  DICTATION: .Other Dictation: Dictation Number [id number]  PLAN OF CARE: Admit to inpatient   PATIENT DISPOSITION:  PACU - hemodynamically stable.   Delay start of Pharmacological VTE agent (>24hrs) due to surgical blood loss or risk of bleeding: not applicable

## 2020-12-16 NOTE — Transfer of Care (Signed)
Immediate Anesthesia Transfer of Care Note  Patient: Holly Sherman  Procedure(s) Performed: REVERSE SHOULDER ARTHROPLASTY (Right Shoulder)  Patient Location: PACU  Anesthesia Type:General  Level of Consciousness: awake, alert  and patient cooperative  Airway & Oxygen Therapy: Patient Spontanous Breathing and Patient connected to face mask oxygen  Post-op Assessment: Report given to RN and Post -op Vital signs reviewed and stable  Post vital signs: Reviewed and stable  Last Vitals:  Vitals Value Taken Time  BP 141/101 12/16/20 1752  Temp    Pulse 92 12/16/20 1754  Resp 25 12/16/20 1754  SpO2 93 % 12/16/20 1754  Vitals shown include unvalidated device data.  Last Pain:  Vitals:   12/16/20 1430  PainSc: 0-No pain         Complications: No complications documented.

## 2020-12-16 NOTE — Progress Notes (Signed)
Assisted Dr. Turk with right, ultrasound guided, interscalene  block. Side rails up, monitors on throughout procedure. See vital signs in flow sheet. Tolerated Procedure well. 

## 2020-12-16 NOTE — Interval H&P Note (Signed)
History and Physical Interval Note:  12/16/2020 2:39 PM  Holly Sherman  has presented today for surgery, with the diagnosis of Right proximal humerus fracture.  The various methods of treatment have been discussed with the patient and family. After consideration of risks, benefits and other options for treatment, the patient has consented to  Procedure(s) with comments: REVERSE SHOULDER ARTHROPLASTY (Right) - with interscalene block as a surgical intervention.  The patient's history has been reviewed, patient examined, no change in status, stable for surgery.  I have reviewed the patient's chart and labs.  Questions were answered to the patient's satisfaction.     Verlee Rossetti

## 2020-12-16 NOTE — Anesthesia Procedure Notes (Signed)
Anesthesia Regional Block: Interscalene brachial plexus block   Pre-Anesthetic Checklist: ,, timeout performed, Correct Patient, Correct Site, Correct Laterality, Correct Procedure, Correct Position, site marked, Risks and benefits discussed,  Surgical consent,  Pre-op evaluation,  At surgeon's request and post-op pain management  Laterality: Right  Prep: chloraprep       Needles:  Injection technique: Single-shot  Needle Type: Echogenic Stimulator Needle     Needle Length: 5cm  Needle Gauge: 22     Additional Needles:   Procedures:,,,, ultrasound used (permanent image in chart),,,,  Narrative:  Start time: 12/16/2020 1:47 PM End time: 12/16/2020 1:54 PM Injection made incrementally with aspirations every 5 mL.  Performed by: Personally  Anesthesiologist: Cecile Hearing, MD  Additional Notes: Functioning IV was confirmed and monitors were applied.  A 46mm 22ga Arrow echogenic stimulator needle was used. Sterile prep and drape, hand hygiene, and sterile gloves were used.  Negative aspiration and negative test dose prior to incremental administration of local anesthetic. The patient tolerated the procedure well.  Ultrasound guidance: relevent anatomy identified, needle position confirmed, local anesthetic spread visualized around nerve(s), vascular puncture avoided.  Image printed for medical record.

## 2020-12-16 NOTE — Discharge Instructions (Signed)
Ice to the shoulder constantly.  Keep the incision covered and clean and dry for one week, then ok to get it wet in the shower.  Do exercise as instructed several times per day. Be very careful getting up and down and while walking to NOT FALL. Use a walker for balance or a cane.   DO NOT reach behind your back or push up out of a chair with the operative arm.  Use a sling while you are up and around for comfort, may remove while seated.  Keep pillow propped behind the operative elbow.  Follow up with Dr Ranell Patrick in two weeks in the office, call [phone] for appt

## 2020-12-16 NOTE — Op Note (Signed)
NAMEMELORA, Holly Sherman MEDICAL RECORD [id number] ACCOUNT [id number] DATE OF BIRTH:May 08, 1941 FACILITY: [facility] LOCATION: [facility] PHYSICIAN:STEVEN Russ Halo, MD  OPERATIVE REPORT  DATE OF PROCEDURE:  12/16/2020  PREOPERATIVE DIAGNOSIS:    Right shoulder proximal humerus fracture dislocation.  POSTOPERATIVE DIAGNOSIS:    Right shoulder proximal humerus fracture dislocation.  PROCEDURE PERFORMED:  Right reverse total shoulder replacement with tuberosity repair using DePuy Delta Xtend prosthesis.  ATTENDING SURGEON:  Malon Kindle, MD  ASSISTANT:  Modesto Charon, [place], who was scrubbed during the entire procedure and necessary for satisfactory completion of surgery.  ANESTHESIA:  General anesthesia was used plus interscalene block.  ESTIMATED BLOOD LOSS:  200 mL.  FLUID REPLACEMENT:  1000 mL crystalloid, 2 units of albumin.  INSTRUMENT COUNTS:  Correct.  COMPLICATIONS:  No complications.  ANTIBIOTICS:  Perioperative antibiotics were given.  INDICATIONS:  The patient is an 80 year old female who suffered a fall injuring her right shoulder.  The patient presented initially to the emergency department where x-rays were obtained demonstrating a proximal humerus fracture dislocation with severe  comminution and displacement with the head split.  We discussed with the patient the need for operative treatment for this particular injury.  The natural history of her injury left alone would be essentially chronic pain and inability to use the arm.   We recommended reverse shoulder replacement to restore fixed focal mechanics to the shoulder with tuberosity repair to hopefully improve function.  The patient agreed with that plan.  Risks including but not limited to infection and instability discussed  with the patient.  Informed consent obtained.  DESCRIPTION OF PROCEDURE:  After an adequate level of anesthesia was achieved, the patient was positioned in modified beach chair position.  Right  shoulder correctly identified and sterilely prepped and draped in the usual manner.  Timeout called,  verifying correct patient, correct site.  We entered the shoulder using a standard deltopectoral approach starting at the coracoid process extending down to the anterior humerus.  Dissection down through subcutaneous tissues using Bovie.  We identified  the fractured humerus.  Extensive hematoma was evacuated.  We tenodesed the biceps with 0 Vicryl suture, figure-of-eight x2 incorporating part of the pec tendon.  We then used the Mayo scissors and split up along the biceps sheath and then used an  osteotome to release the lesser tuberosity from the part of the head.  Once that was done, we placed two #2 FiberWire sutures medial to the lesser tuberosity into the subscap with the suture limbs coming out superficial to the lesser tuberosity.  We then  went ahead and used a T-handled Geographical information systems officer, over the top of the remnant of the humeral head, there was a part of the head that was still attached to the greater tuberosity and we used an osteotome to remove that head and then rongeurs to thin down  the tuberosity to make it wafer thin as best we could.  Two #2 FiberWire sutures lateral to the greater tuberosity into the rotator cuff in a mattress fashion.  Next, we removed the head, it was really difficult to get that out of the axillary pouch.  We  went ahead and did that piecemeal with rongeur, and we were able to get the entire humeral head out of the inferior capsule area.  I did a finger sweep to make sure we were clear and there was no remaining head.  We also were able to visualize very well  once that head was out.  At this point,  we directed our attention towards the glenoid.  Basically, the patient had normal cartilage.  We had to remove the labrum and the cartilage.  She had an extremely small, about 24 mm glenoid.  We found the center  point for that once we had the cartilage off and then drilled  our guide pin.  We then reamed for the metaglene baseplate and drilled out our central peg hole, impacted the metaglene into position.  We had good bony support for the metaglene.  We placed a  42 screw inferiorly, a 30 screw superiorly and that was all we had room for but we had a nice secure baseplate.  We selected a 38 standard glenosphere and screwed that onto the baseplate.  I did a finger sweep to make sure we had no soft tissue caught  up and there were no bone fragments.  We then went out to the humeral side and reamed this humeral shaft up to a size 8.  We could not get any bigger than that.  We then took an 8 stem with the right fracture metaphysis set on the 0 setting and we  impacted that in 10 degrees of retroversion.  We felt like there was pretty good stability, but we would still probably be back it up with some cement.  We then trialed with a 38+3 and that reduction provided a nice stable shoulder.  I was able to get  the tuberosities reduced over the fracture metaphysis.  We then removed the trial components.  We then irrigated the canal and placed a sponge to dry the canal while we vacuum mixed HV cement on the back table.  We selected the real Por[place]oat 8 stem from  the Porocoat right fracture tray and then attached this with a 0 setting and we placed that with cement around just the upper portion of it and impacted that into the humeral canal, again going all the way down to where we had good bone support.  We  allowed the cement to harden.  We already placed around the world stitch through one of the eyelets on the stem and then we also had another stitch through the anterior fin.  Once the cement was allowed to harden, we were pleased with our height and the  support that we had for the humeral component.  We then selected the 38+3 and there was a little bit of inferior sulcus with that, so went with the +6.  We selected the real 38+6 poly and impacted that on the humeral tray and  reduced the shoulder, nice  little pop as it reduced, very minimal gapping with max external rotation and inferior pull.  We feel like that will go away with our tuberosity repair.  Utilizing sutures through the implant and then the sutures on the medial side of the lesser  tuberosity and lateral side of the greater tuberosity, we were able to fix an anatomic tuberosity repair.  We bone grafted extensively around the Porocoat proximal portion to facilitate the healing of the tuberosity to each other and also to the shaft  and then we went ahead and anatomically repaired those tuberosities.  With all the FiberWire suture tied, everything moved together as a unit, and we had excellent range of motion with really no stoppage or blockage of range of motion at all.  We  irrigated thoroughly, closed the deltopectoral interval with 0 Vicryl suture followed by 2-0 Vicryl for subcutaneous closure and staples for skin.  Sterile dressing  applied followed by a shoulder sling.  The patient transported to recovery room in stable  condition.  HN/NUANCE  D:12/16/2020 T:12/16/2020 JOB:[id number]

## 2020-12-16 NOTE — Anesthesia Procedure Notes (Signed)
Procedure Name: Intubation Date/Time: 12/16/2020 3:39 PM Performed by: Eben Burow, CRNA Pre-anesthesia Checklist: Patient identified, Emergency Drugs available, Suction available, Patient being monitored and Timeout performed Patient Re-evaluated:Patient Re-evaluated prior to induction Oxygen Delivery Method: Circle system utilized Preoxygenation: Pre-oxygenation with 100% oxygen Induction Type: IV induction Ventilation: Mask ventilation without difficulty Laryngoscope Size: Mac and 4 Grade View: Grade I Tube type: Oral Tube size: 7.0 mm Number of attempts: 1 Airway Equipment and Method: Stylet Placement Confirmation: ETT inserted through vocal cords under direct vision,  positive ETCO2 and breath sounds checked- equal and bilateral Secured at: 23 cm Tube secured with: Tape Dental Injury: Teeth and Oropharynx as per pre-operative assessment

## 2020-12-16 NOTE — Anesthesia Preprocedure Evaluation (Addendum)
Anesthesia Evaluation  Patient identified by MRN, date of birth, ID band Patient awake    Reviewed: Allergy & Precautions, NPO status , Patient's Chart, lab work & pertinent test results, reviewed documented beta blocker date and time   Airway Mallampati: II  TM Distance: >3 FB Neck ROM: Full    Dental  (+) Dental Advisory Given, Edentulous Upper, Missing, Partial Lower,    Pulmonary asthma , former smoker,    Pulmonary exam normal breath sounds clear to auscultation       Cardiovascular hypertension, Pt. on medications and Pt. on home beta blockers Normal cardiovascular exam+ dysrhythmias Supra Ventricular Tachycardia + pacemaker  Rhythm:Regular Rate:Normal     Neuro/Psych PSYCHIATRIC DISORDERS Anxiety negative neurological ROS     GI/Hepatic Neg liver ROS, GERD  Medicated,  Endo/Other  negative endocrine ROS  Renal/GU negative Renal ROS     Musculoskeletal  (+) Arthritis , Right proximal humerus fracture   Abdominal   Peds  Hematology  (+) Blood dyscrasia, anemia ,   Anesthesia Other Findings Day of surgery medications reviewed with the patient.  Reproductive/Obstetrics                            Anesthesia Physical Anesthesia Plan  ASA: III  Anesthesia Plan: General   Post-op Pain Management:  Regional for Post-op pain   Induction: Intravenous  PONV Risk Score and Plan: 3 and Dexamethasone and Ondansetron  Airway Management Planned: Oral ETT  Additional Equipment:   Intra-op Plan:   Post-operative Plan: Extubation in OR  Informed Consent: I have reviewed the patients History and Physical, chart, labs and discussed the procedure including the risks, benefits and alternatives for the proposed anesthesia with the patient or authorized representative who has indicated his/her understanding and acceptance.     Dental advisory given  Plan Discussed with: CRNA  Anesthesia  Plan Comments:         Anesthesia Quick Evaluation

## 2020-12-16 NOTE — Anesthesia Postprocedure Evaluation (Signed)
Anesthesia Post Note  Patient: Dezhane Staten  Procedure(s) Performed: REVERSE SHOULDER ARTHROPLASTY (Right Shoulder)     Patient location during evaluation: PACU Anesthesia Type: General Level of consciousness: awake and alert Pain management: pain level controlled Vital Signs Assessment: post-procedure vital signs reviewed and stable Respiratory status: spontaneous breathing, nonlabored ventilation, respiratory function stable and patient connected to nasal cannula oxygen Cardiovascular status: blood pressure returned to baseline and stable Postop Assessment: no apparent nausea or vomiting Anesthetic complications: no   No complications documented.  Last Vitals:  Vitals:   12/16/20 1845 12/16/20 1900  BP: (!) 143/89 (!) 160/87  Pulse: 72 88  Resp: (!) 21 (!) 24  Temp:    SpO2: 95% 92%    Last Pain:  Vitals:   12/16/20 1900  PainSc: 0-No pain                 Cecile Hearing

## 2020-12-17 ENCOUNTER — Observation Stay (HOSPITAL_COMMUNITY): Payer: Medicare HMO

## 2020-12-17 LAB — HEMOGLOBIN AND HEMATOCRIT, BLOOD
HCT: 29 % — ABNORMAL LOW (ref 36.0–46.0)
Hemoglobin: 9.2 g/dL — ABNORMAL LOW (ref 12.0–15.0)

## 2020-12-17 LAB — BASIC METABOLIC PANEL
Anion gap: 10 (ref 5–15)
BUN: 9 mg/dL (ref 8–23)
CO2: 28 mmol/L (ref 22–32)
Calcium: 8.4 mg/dL — ABNORMAL LOW (ref 8.9–10.3)
Chloride: 102 mmol/L (ref 98–111)
Creatinine, Ser: 0.66 mg/dL (ref 0.44–1.00)
GFR, Estimated: >60 mL/min (ref >60)
Glucose, Bld: 144 mg/dL — ABNORMAL HIGH (ref 70–99)
Potassium: 3.9 mmol/L (ref 3.5–5.1)
Sodium: 140 mmol/L (ref 135–145)

## 2020-12-17 MED ORDER — ALBUTEROL SULFATE (2.5 MG/3ML) 0.083% IN NEBU
2.5000 mg | INHALATION_SOLUTION | Freq: Four times a day (QID) | RESPIRATORY_TRACT | Status: DC | PRN
  Administered 2020-12-17 – 2020-12-18 (×2): 2.5 mg via RESPIRATORY_TRACT
  Filled 2020-12-17 (×2): qty 3

## 2020-12-17 NOTE — Evaluation (Addendum)
Physical Therapy Evaluation Patient Details Name: Holly Sherman MRN: [id number] DOB: 1941-10-14 Today's Date: 12/17/2020      History of Present Illness  80 yo female s/p R reverse TSA 12/16/20 after sustaining a proximal humerus fx after falling at home. Hx of vertigo, falls, SDH, SSS, PSVT  Clinical Impression  Found pt in bathroom on RA (pt stated she had to rush to bathroom 2* bowel movement). After walking back to recliner, assessed O2-sat reading 80%.  After resting in recliner and donning [place], sats increased to 97% on 2L. Pt then walked in hallway with straight cane and on 2L  O2-sat reading 94%. Overall, pt was Min guard assist for mobility. Once back in bed, pt c/o orthopnea + dizziness. Elevated HOB to ~60 degrees and rechecked sats-O2 reading 99% on 3L. Pt stated she has history of vertigo and takes medicine as needed (made RN aware). Per PA (Brad Dixon) note, "Plan to d/c this afternoon as long as she can wean off oxygen". Unfortunately, pt is still requiring O2 for activity. Will continue to follow during hospital stay and progress activity as tolerated.     Follow Up Recommendations Home health PT;Supervision/Assistance - 24 hour    Equipment Recommendations  None recommended by PT    Recommendations for Other Services OT consult     Precautions / Restrictions Precautions Precautions: Fall;Shoulder Type of Shoulder Precautions: Conservatioin, No AROM, NO PROM Shoulder Interventions: Shoulder sling/immobilizer;At all times;Off for dressing/bathing/exercises Precaution Booklet Issued:  (hadnout) Required Braces or Orthoses: Sling Restrictions Weight Bearing Restrictions: Yes RUE Weight Bearing: Non weight bearing      Mobility  Bed Mobility Overal bed mobility: Needs Assistance Bed Mobility: Sit to Supine     Supine to sit: Min assist;HOB elevated Sit to supine: Supervision;HOB elevated   General bed mobility comments: Pt c/o orthopnea and dizziness once back  in bed. Raised HOB to ~60 degrees and cued pt to limit eye/head movement and take slow , deep breaths.    Transfers Overall transfer level: Needs assistance Equipment used: 1 person hand held assist Transfers: Sit to/from [employer] Sit to Stand: Min guard Stand pivot transfers: Min guard       General transfer comment: min guard to ambulate in room to bathroom on RA. Patient's o2 sat dropped to 84% in bathroom.  Ambulation/Gait Ambulation/Gait assistance: Min guard Gait Distance (Feet): 70 Feet Assistive device: Straight cane Gait Pattern/deviations: Step-through pattern;Decreased stride length     General Gait Details: Used cane for safety, steadying. Slow, guarded gait. O2 94% on 2L O2 ( 80% on RA after walking back from bathroom)  Stairs            Wheelchair Mobility    Modified Rankin (Stroke Patients Only)       Balance Overall balance assessment: History of Falls;Needs assistance           Standing balance-Leahy Scale: Fair                               Pertinent Vitals/Pain Pain Assessment: No/denies pain    Home Living Family/patient expects to be discharged to:: Private residence Living Arrangements: Children Available Help at Discharge: Family Type of Home: House Home Access: Stairs to enter   Secretary/administrator of Steps: 4 Home Layout: One level Home Equipment: Environmental consultant - 2 wheels;Cane - single point;Tub bench Additional Comments: 3 steps with rail inside house to kitchen    Prior  Function Level of Independence: Independent with assistive device(s)               Hand Dominance   Dominant Hand: Right    Extremity/Trunk Assessment   Upper Extremity Assessment Upper Extremity Assessment: Defer to OT evaluation RUE Deficits / Details: AROM of lower arm limited by continued effects of interscalene block RUE: Unable to fully assess due to immobilization    Lower Extremity Assessment Lower  Extremity Assessment: Generalized weakness    Cervical / Trunk Assessment Cervical / Trunk Assessment: Kyphotic  Communication   Communication: No difficulties  Cognition Arousal/Alertness: Awake/alert Behavior During Therapy: WFL for tasks assessed/performed Overall Cognitive Status: Within Functional Limits for tasks assessed                                        General Comments      Exercises Donning/doffing shirt without moving shoulder: Patient able to independently direct caregiver Method for sponge bathing under operated UE: Patient able to independently direct caregiver Donning/doffing sling/immobilizer: Patient able to independently direct caregiver Correct positioning of sling/immobilizer: Patient able to independently direct caregiver ROM for elbow, wrist and digits of operated UE: Patient able to independently direct caregiver Sling wearing schedule (on at all times/off for ADL's): Independent Proper positioning of operated UE when showering: Independent Dressing change: Independent Positioning of UE while sleeping: Independent   Assessment/Plan    PT Assessment Patient needs continued PT services  PT Problem List Decreased mobility;Decreased activity tolerance;Decreased balance;Decreased knowledge of precautions       PT Treatment Interventions DME instruction;Gait training;Therapeutic exercise;Therapeutic activities;Patient/family education;Balance training;Functional mobility training    PT Goals (Current goals can be found in the Care Plan section)  Acute Rehab PT Goals Patient Stated Goal: to breathe better. to not need O2 PT Goal Formulation: With patient Time For Goal Achievement: 12/31/20 Potential to Achieve Goals: Good    Frequency Min 3X/week   Barriers to discharge        Co-evaluation               AM-PAC PT "6 Clicks" Mobility  Outcome Measure Help needed turning from your back to your side while in a flat bed  without using bedrails?: A Little Help needed moving from lying on your back to sitting on the side of a flat bed without using bedrails?: A Little Help needed moving to and from a bed to a chair (including a wheelchair)?: A Little Help needed standing up from a chair using your arms (e.g., wheelchair or bedside chair)?: A Little Help needed to walk in hospital room?: A Little Help needed climbing 3-5 steps with a railing? : A Little 6 Click Score: 18    End of Session Equipment Utilized During Treatment: Oxygen;Gait belt Activity Tolerance: Patient limited by fatigue (limited by dyspnea) Patient left: in bed;with call bell/phone within reach;with bed alarm set   PT Visit Diagnosis: Muscle weakness (generalized) (M62.81);Unsteadiness on feet (R26.81);Difficulty in walking, not elsewhere classified (R26.2);History of falling (Z91.81)    Time: 3976-7341 PT Time Calculation (min) (ACUTE ONLY): 23 min   Charges:   PT Evaluation $PT Eval Low Complexity: 1 Low PT Treatments $Gait Training: 8-22 mins           Faye Ramsay, PT Acute Rehabilitation  Office: [phone] Pager: [phone]

## 2020-12-17 NOTE — Evaluation (Signed)
Occupational Therapy Evaluation Patient Details Name: Holly Sherman MRN: [id number] DOB: 02-17-1941 Today's Date: 12/17/2020    History of Present Illness Patient s/p right rever shoulder arthroplasty.   Clinical Impression   Mrs. Kelee Cunningham is an 80 year old woman who presents s/p shoulder replacement without functional use of right dominant upper extremity secondary to effects of surgery, interscalene block and shoulder precautions. Patient found supine in bed on 3 L [place]. Patient min assist to transfer into sitting and min guard to ambulate to bathroom. O2 sat dropped to 84% on RA with short ambulation into bathroom. Therapist provided education and instruction to patient in regards to exercises, precautions, positioning, donning upper extremity clothing and bathing while maintaining shoulder precautions, ice and edema management and donning/doffing sling. Patient verbalized understanding and handouts provided to maximize retention of education. Due to patient's oxygen saturation dropping unsure if patient will dc. Will follow acutely while in hospital in order to progress patient to independence, reiterate shoulder education and assist with weaning of oxygen.    Follow Up Recommendations  Follow surgeon's recommendation for DC plan and follow-up therapies    Equipment Recommendations  None recommended by OT    Recommendations for Other Services       Precautions / Restrictions Precautions Precautions: Shoulder Type of Shoulder Precautions: Conservatioin, No AROM, NO PROM Shoulder Interventions: Shoulder sling/immobilizer;At all times;Off for dressing/bathing/exercises Precaution Booklet Issued:  (hadnout) Required Braces or Orthoses: Sling Restrictions Weight Bearing Restrictions: Yes RUE Weight Bearing: Non weight bearing      Mobility Bed Mobility Overal bed mobility: Needs Assistance Bed Mobility: Supine to Sit     Supine to sit: Min assist;HOB elevated      General bed mobility comments: min assist for hand hold to pull up on to come into sitting    Transfers Overall transfer level: Needs assistance Equipment used: 1 person hand held assist Transfers: Sit to/from [employer] Sit to Stand: Min guard Stand pivot transfers: Min guard       General transfer comment: min guard to ambulate in room to bathroom on RA. Patient's o2 sat dropped to 84% in bathroom.    Balance Overall balance assessment: Mild deficits observed, not formally tested                                         ADL either performed or assessed with clinical judgement   ADL Overall ADL's : Needs assistance/impaired Eating/Feeding: Set up   Grooming: Set up;Wash/dry hands   Upper Body Bathing: Minimal assistance;Sitting   Lower Body Bathing: Minimal assistance;Sit to/from stand   Upper Body Dressing : Moderate assistance;Sitting;Adhering to UE precautions   Lower Body Dressing: Moderate assistance;Sit to/from stand Lower Body Dressing Details (indicate cue type and reason): adhering to UE precautions Toilet Transfer: Min guard;Grab Engineer, mining;Ambulation Toilet Transfer Details (indicate cue type and reason): min guard to ambulate to bathroom. Toileting- Architect and Hygiene: Min guard;Minimal assistance Toileting - Clothing Manipulation Details (indicate cue type and reason): min assist for clothing management     Functional mobility during ADLs: Min guard       Vision Baseline Vision/History: No visual deficits       Perception     Praxis      Pertinent Vitals/Pain Pain Assessment: No/denies pain     Hand Dominance Right   Extremity/Trunk Assessment Upper Extremity Assessment Upper Extremity Assessment: RUE  deficits/detail RUE Deficits / Details: AROM of lower arm limited by continued effects of interscalene block RUE: Unable to fully assess due to immobilization   Lower Extremity  Assessment Lower Extremity Assessment: Defer to PT evaluation   Cervical / Trunk Assessment Cervical / Trunk Assessment: Kyphotic   Communication Communication Communication: No difficulties   Cognition Arousal/Alertness: Awake/alert Behavior During Therapy: WFL for tasks assessed/performed Overall Cognitive Status: Within Functional Limits for tasks assessed                                     General Comments       Exercises     Shoulder Instructions Shoulder Instructions Donning/doffing shirt without moving shoulder: Patient able to independently direct caregiver Method for sponge bathing under operated UE: Patient able to independently direct caregiver Donning/doffing sling/immobilizer: Patient able to independently direct caregiver Correct positioning of sling/immobilizer: Patient able to independently direct caregiver ROM for elbow, wrist and digits of operated UE: Patient able to independently direct caregiver Sling wearing schedule (on at all times/off for ADL's): Independent Proper positioning of operated UE when showering: Independent Dressing change: Independent Positioning of UE while sleeping: Independent    Home Living Family/patient expects to be discharged to:: Private residence Living Arrangements: Children Available Help at Discharge: Family Type of Home: House Home Access: Stairs to enter Secretary/administrator of Steps: 4   Home Layout: One level     Bathroom Shower/Tub: Chief Strategy Officer: Standard     Home Equipment: Environmental consultant - 2 wheels;Cane - single point;Tub bench   Additional Comments: 3 steps with rail inside house to kitchen      Prior Functioning/Environment Level of Independence: Independent with assistive device(s)                 OT Problem List: Decreased strength;Decreased range of motion;Decreased activity tolerance;Impaired balance (sitting and/or standing);Cardiopulmonary status limiting  activity;Pain;Impaired UE functional use      OT Treatment/Interventions: Self-care/ADL training;Therapeutic exercise;DME and/or AE instruction;Therapeutic activities;Patient/family education    OT Goals(Current goals can be found in the care plan section) Acute Rehab OT Goals OT Goal Formulation: With patient Time For Goal Achievement: 12/24/20 Potential to Achieve Goals: Good  OT Frequency: Min 2X/week   Barriers to D/C:            Co-evaluation              AM-PAC OT "6 Clicks" Daily Activity     Outcome Measure Help from another person eating meals?: A Little Help from another person taking care of personal grooming?: A Little Help from another person toileting, which includes using toliet, bedpan, or urinal?: A Little Help from another person bathing (including washing, rinsing, drying)?: A Little Help from another person to put on and taking off regular upper body clothing?: A Lot Help from another person to put on and taking off regular lower body clothing?: A Lot 6 Click Score: 16   End of Session Nurse Communication: Mobility status (o2 sat)  Activity Tolerance: Patient tolerated treatment well Patient left: in chair;with call bell/phone within reach  OT Visit Diagnosis: Unsteadiness on feet (R26.81);Muscle weakness (generalized) (M62.81);Pain Pain - Right/Left: Right Pain - part of body: Shoulder                Time: 4132-4401 OT Time Calculation (min): 38 min Charges:  OT General Charges $OT Visit: 1 Visit OT Evaluation $  OT Eval Moderate Complexity: 1 Mod OT Treatments $Self Care/Home Management : 8-22 mins  Bradley Handyside, OTR/L Acute Care Rehab Services  Office [phone] Pager: [phone]   Kelli Churn 12/17/2020, 10:23 AM

## 2020-12-17 NOTE — Care Management Obs Status (Signed)
MEDICARE OBSERVATION STATUS NOTIFICATION   Patient Details  Name: Neeta Storey MRN: [id number] Date of Birth: December 29, 1940   Medicare Observation Status Notification Given:  Yes    Armanda Heritage, RN 12/17/2020, 9:52 AM

## 2020-12-17 NOTE — Discharge Summary (Signed)
In most cases prophylactic antibiotics for Dental procdeures after total joint surgery are not necessary.  Exceptions are as follows:  1. History of prior total joint infection  2. Severely immunocompromised (Organ Transplant, cancer chemotherapy, Rheumatoid biologic meds such as Humera)  3. Poorly controlled diabetes (A1C &gt; 8.0, blood glucose over 200)  If you have one of these conditions, contact your surgeon for an antibiotic prescription, prior to your dental procedure. Orthopedic Discharge Summary        Physician Discharge Summary  Patient ID: Holly Sherman MRN: [id number] DOB/AGE: 1941-07-16 80 y.o.  Admit date: 12/16/2020 Discharge date: 12/17/2020   Procedures:  Procedure(s) (LRB): REVERSE SHOULDER ARTHROPLASTY (Right)  Attending Physician:  Dr. Malon Kindle  Admission Diagnoses:   Right proximal humerus fracture  Discharge Diagnoses:  Right proximal humerus fracture   Past Medical History:  Diagnosis Date  . Acute respiratory failure with hypoxemia (HCC)   . Anxiety   . Arthritis   . Asthma   . Bradycardia   . GERD (gastroesophageal reflux disease)   . High cholesterol   . Hypertension   . Paroxysmal supraventricular tachycardia (HCC)   . Sick sinus syndrome (HCC)   . Subdural hemorrhage (HCC)   . Vertigo     PCP: Ardyth Gal, MD   Discharged Condition: good  Hospital Course:  Patient underwent the above stated procedure on 12/16/2020. Patient tolerated the procedure well and brought to the recovery room in good condition and subsequently to the floor. Patient had an uncomplicated hospital course and was stable for discharge.   Disposition: Discharge disposition: 01-Home or Self Care      with follow up in 2 weeks    Follow-up Information    Beverely Low, MD. Call in 2 weeks.   Specialty: Orthopedic Surgery Why: [phone] Contact information: [address] [place] [address] [phone]                Dental Antibiotics:  In most cases prophylactic antibiotics for Dental procdeures after total joint surgery are not necessary.  Exceptions are as follows:  1. History of prior total joint infection  2. Severely immunocompromised (Organ Transplant, cancer chemotherapy, Rheumatoid biologic meds such as Humera)  3. Poorly controlled diabetes (A1C &gt; 8.0, blood glucose over 200)  If you have one of these conditions, contact your surgeon for an antibiotic prescription, prior to your dental procedure.  Discharge Instructions    Call MD / Call 911   Complete by: As directed    If you experience chest pain or shortness of breath, CALL 911 and be transported to the hospital emergency room.  If you develope a fever above 101 F, pus (white drainage) or increased drainage or redness at the wound, or calf pain, call your surgeon's office.   Constipation Prevention   Complete by: As directed    Drink plenty of fluids.  Prune juice may be helpful.  You may use a stool softener, such as Colace (over the counter) 100 mg twice a day.  Use MiraLax (over the counter) for constipation as needed.   Diet - low sodium heart healthy   Complete by: As directed    Increase activity slowly as tolerated   Complete by: As directed       Allergies as of 12/17/2020      Reactions   Atorvastatin Other (See Comments)   Muscle pain/ patient is taking   Codeine Other (See Comments)   GI- upset   Colesevelam  Nausea And Vomiting   Hydrochlorothiazide W-amiloride    Rosuvastatin Other (See Comments)   Other reaction(s): MYALGIA,HIP WEAKNESS Myalgia, Hip Weakness.   Moxifloxacin Rash      Medication List    TAKE these medications   albuterol 108 (90 Base) MCG/ACT inhaler Commonly known as: VENTOLIN HFA Inhale 2 puffs into the lungs every 6 (six) hours as needed for wheezing or shortness of breath.   amLODipine 5 MG tablet Commonly known as: NORVASC Take 5 mg by mouth daily.   atorvastatin 20  MG tablet Commonly known as: LIPITOR Take 20 mg by mouth at bedtime.   cyclobenzaprine 5 MG tablet Commonly known as: FLEXERIL Take 5 mg by mouth at bedtime as needed for muscle spasms.   ergocalciferol 1.25 MG (50000 UT) capsule Commonly known as: VITAMIN D2 Take 50,000 Units by mouth once a week.   escitalopram 10 MG tablet Commonly known as: LEXAPRO Take 10 mg by mouth daily.   furosemide 20 MG tablet Commonly known as: LASIX Take 20 mg by mouth daily as needed for edema.   losartan 100 MG tablet Commonly known as: COZAAR Take 100 mg by mouth daily.   meclizine 25 MG tablet Commonly known as: ANTIVERT Take 25 mg by mouth 3 (three) times daily as needed for dizziness.   metoprolol succinate 25 MG 24 hr tablet Commonly known as: TOPROL-XL Take 25 mg by mouth daily.   montelukast 10 MG tablet Commonly known as: SINGULAIR Take 10 mg by mouth daily as needed (allergies).   mupirocin ointment 2 % Commonly known as: BACTROBAN Apply 1 application topically daily as needed (Rash).   omeprazole 20 MG capsule Commonly known as: PRILOSEC Take 20 mg by mouth daily.   polyethylene glycol 17 g packet Commonly known as: MIRALAX / GLYCOLAX Take 17 g by mouth daily as needed.   potassium chloride 10 MEQ tablet Commonly known as: KLOR-CON Take 10 mEq by mouth See admin instructions.   Systane 0.4-0.3 % Soln Generic drug: Polyethyl Glycol-Propyl Glycol Place 1 drop into both eyes daily as needed (Dry eyes).   traMADol 50 MG tablet Commonly known as: ULTRAM Take 1 tablet (50 mg total) by mouth every 6 (six) hours as needed for severe pain or moderate pain.         Signed: Thea Gist 12/17/2020, 8:43 AM  [facility] is now [employer] [address], [address], [place], [place] [address] Phone: [phone] Facebook  Instagram  [employer]

## 2020-12-17 NOTE — Care Management CC44 (Signed)
Condition Code 44 Documentation Completed  Patient Details  Name: Holly Sherman MRN: [id number] Date of Birth: 09/09/1941   Condition Code 44 given:  Yes Patient signature on Condition Code 44 notice:  Yes Documentation of 2 MD's agreement:  Yes Code 44 added to claim:  Yes    Armanda Heritage, RN 12/17/2020, 9:52 AM

## 2020-12-17 NOTE — Progress Notes (Signed)
Stevenson Clinch, PA notified of xray results. Acknowledged. No new orders at this time.

## 2020-12-17 NOTE — Progress Notes (Signed)
   Subjective: 1 Day Post-Op Procedure(s) (LRB): REVERSE SHOULDER ARTHROPLASTY (Right)  Pt doing well Decreased pain and no new symptoms Interscalene block still working causing numbness in her thumb Otherwise doing well  Patient reports pain as mild.  Objective:   VITALS:   Vitals:   12/17/20 0020 12/17/20 0427  BP: 140/78 138/65  Pulse: 76 60  Resp: 20 18  Temp: 98.5 F (36.9 C) 97.8 F (36.6 C)  SpO2: 96% 94%    Right shoulder incision healing well Dressing changed Mild edema and moderate ecchymosis No rashes or edema distally Sling in place  LABS Recent Labs    12/16/20 1230 12/17/20 0554  HGB 10.5* 9.2*  HCT 32.9* 29.0*  WBC 7.3  --   PLT 275  --     Recent Labs    12/16/20 1230 12/17/20 0554  NA 138 140  K 3.9 3.9  BUN 9 9  CREATININE 0.65 0.66  GLUCOSE 117* 144*     Assessment/Plan: 1 Day Post-Op Procedure(s) (LRB): REVERSE SHOULDER ARTHROPLASTY (Right) Overall pt doing well, awake and alert Plan to d/c this afternoon as long as she can wean off oxygen F/u in 2 weeks in the office Continue use of the sling Pain management as needed     Elizebeth Koller, MPAS [facility] is now [employer] [address], [place], [place] [address] Phone: [phone] [email or web address] Facebook  [employer]

## 2020-12-17 NOTE — Progress Notes (Signed)
Standley Dakins, PA  paged through on call service, [facility], to update on condition, inability to wean O2 for discharge and Pt recommendation for [facility] with DC. Awaiting return phone call.

## 2020-12-18 NOTE — Progress Notes (Signed)
Occupational Therapy Treatment Patient Details Name: Holly Sherman MRN: [id number] DOB: 07-30-1941 Today's Date: 12/18/2020    History of present illness 80 yo female s/p R reverse TSA 12/16/20 after sustaining a proximal humerus fx after falling at home. Hx of vertigo, falls, SDH, SSS, PSVT. Patient desatting with activity and needing O2 requiring longer hospitilization.   OT comments  Patient presents today in bed on 4 L [place] and reporting overall fatigue and tiredness. Patient supervision to transfer to edge of bed and needing to go the bathroom. Min assist for ambulation to bathroom due to unsteadiness. Patient's o2 dropped to 87% on RA with ambulating to bathroom. Patient placed on 2 L [place]. Patient reported dizziness and feeling like her heart was racing. Vital signs monitored and WFL - HR 79 and BP 155/76. Patient able to perform hygiene aspect of toileting and managing hospital gown but needed assistance pulling underwear up and down on right. Patient ambulated back to recliner. O2  sats maintaining in the 90s on 2L throughout remainder of treatment. Therapist reiterated shoulder precautions, UE positioning, sling position, how to perform bathing. Patient has a daughter that can assist her with ADLs and has done so since her original injury - with similar precautions. No family present however to educate. Handouts provided and therapist reminded patient to give to daughter to read. Will continue to follow acutely.   Follow Up Recommendations  Follow surgeon's recommendation for DC plan and follow-up therapies    Equipment Recommendations  None recommended by OT    Recommendations for Other Services      Precautions / Restrictions Precautions Precautions: Fall;Shoulder Type of Shoulder Precautions: Conservatioin, No AROM, NO PROM Shoulder Interventions: Shoulder sling/immobilizer;At all times;Off for dressing/bathing/exercises Precaution Booklet Issued: Yes (comment) (handout) Required  Braces or Orthoses: Sling Restrictions Weight Bearing Restrictions: Yes RUE Weight Bearing: Non weight bearing       Mobility Bed Mobility Overal bed mobility: Needs Assistance Bed Mobility: Supine to Sit     Supine to sit: Supervision;HOB elevated     General bed mobility comments: use of bed rail to transfer to edge of bed.  Transfers Overall transfer level: Needs assistance Equipment used: 1 person hand held assist Transfers: Sit to/from [employer] Sit to Stand: Min assist Stand pivot transfers: Min assist       General transfer comment: Min assist for steadying to ambulate to bathroom and back. Dropped to 87% on RA.    Balance Overall balance assessment: Mild deficits observed, not formally tested                                         ADL either performed or assessed with clinical judgement   ADL Overall ADL's : Needs assistance/impaired                         Toilet Transfer: Minimal assistance;Ambulation;Regular Teacher, adult education Details (indicate cue type and reason): Min assist for steadying for ambulation to bathroom and toilet transfer. Toileting- Clothing Manipulation and Hygiene: Moderate assistance;Sit to/from stand Toileting - Clothing Manipulation Details (indicate cue type and reason): Mod assist to help with clothing management on right hip.             Vision Patient Visual Report: No change from baseline     Perception     Praxis      Cognition  Arousal/Alertness: Awake/alert Behavior During Therapy: WFL for tasks assessed/performed Overall Cognitive Status: Within Functional Limits for tasks assessed                                 General Comments: Maybe some mild forgetfulness. Had to reiterize shoulder precautions.        Exercises     Shoulder Instructions Shoulder Instructions Method for sponge bathing under operated UE: Independent Correct positioning of  sling/immobilizer: Patient able to independently direct caregiver ROM for elbow, wrist and digits of operated UE: Patient able to independently direct caregiver Sling wearing schedule (on at all times/off for ADL's): Patient able to independently direct caregiver Dressing change: Patient able to independently direct caregiver Positioning of UE while sleeping: Patient able to independently direct caregiver     General Comments      Pertinent Vitals/ Pain       Pain Assessment: No/denies pain  Home Living                                          Prior Functioning/Environment              Frequency  Min 2X/week        Progress Toward Goals  OT Goals(current goals can now be found in the care plan section)  Progress towards OT goals: Progressing toward goals  Acute Rehab OT Goals Patient Stated Goal: to breathe better. to not need O2 OT Goal Formulation: With patient Time For Goal Achievement: 12/24/20 Potential to Achieve Goals: Good  Plan Discharge plan remains appropriate    Co-evaluation                 AM-PAC OT "6 Clicks" Daily Activity     Outcome Measure   Help from another person eating meals?: A Little Help from another person taking care of personal grooming?: A Little Help from another person toileting, which includes using toliet, bedpan, or urinal?: A Little Help from another person bathing (including washing, rinsing, drying)?: A Little Help from another person to put on and taking off regular upper body clothing?: A Lot Help from another person to put on and taking off regular lower body clothing?: A Lot 6 Click Score: 16    End of Session Equipment Utilized During Treatment: Oxygen  OT Visit Diagnosis: Unsteadiness on feet (R26.81);Muscle weakness (generalized) (M62.81);Pain   Activity Tolerance Patient tolerated treatment well   Patient Left in chair;with call bell/phone within reach   Nurse Communication Mobility  status (o2 sats)        Time: 1914-7829 OT Time Calculation (min): 25 min  Charges: OT General Charges $OT Visit: 1 Visit OT Treatments $Self Care/Home Management : 8-22 mins $Therapeutic Activity: 8-22 mins  Jaylena Holloway, OTR/L Acute Care Rehab Services  Office [phone] Pager: [phone]    Kelli Churn 12/18/2020, 12:30 PM

## 2020-12-18 NOTE — Progress Notes (Addendum)
Physical Therapy Treatment Patient Details Name: Holly Sherman MRN: [id number] DOB: [birth year] Today's Date: 12/18/2020    History of Present Illness 80 yo female s/p R reverse TSA 12/16/20 after sustaining a proximal humerus fx after falling at home. Hx of vertigo, falls, SDH, SSS, PSVT. Patient desatting with activity and needing O2 requiring longer hospitilization.    PT Comments    Pt continues to participate well. She continues to require 02 for activity. Will continue to follow during hospital stay. O2 89% on RA at rest, 95% on 2L ambulation, dyspnea 2/4.   Follow Up Recommendations  Home health PT;Supervision/Assistance - 24 hour     Equipment Recommendations  None recommended by PT    Recommendations for Other Services OT consult     Precautions / Restrictions Precautions Precautions: Fall Type of Shoulder Precautions: Conservatioin, No AROM, NO PROM Shoulder Interventions: Shoulder sling/immobilizer;At all times;Off for dressing/bathing/exercises Required Braces or Orthoses: Sling Restrictions Weight Bearing Restrictions: Yes RUE Weight Bearing: Non weight bearing    Mobility  Bed Mobility Overal bed mobility: Needs Assistance Bed Mobility: Supine to Sit          General bed mobility comments: oob in recliner    Transfers Overall transfer level: Needs assistance Equipment used: None Transfers: Sit to/from Stand Sit to Stand: Min guard        General transfer comment: Min guard for safety.  Ambulation/Gait Ambulation/Gait assistance: Min assist Gait Distance (Feet): 75 Feet Assistive device: Straight cane Gait Pattern/deviations: Step-through pattern;Decreased stride length     General Gait Details: Used cane for safety, steadying. Slow, guarded gait. O2 95% on 2L O2, dyspnea 2/4   Stairs             Wheelchair Mobility    Modified Rankin (Stroke Patients Only)       Balance Overall balance assessment: Mild deficits observed,  not formally tested                                          Cognition Arousal/Alertness: Awake/alert Behavior During Therapy: WFL for tasks assessed/performed Overall Cognitive Status: Within Functional Limits for tasks assessed                                       Exercises     General Comments        Pertinent Vitals/Pain Pain Assessment: No/denies pain    Home Living                      Prior Function            PT Goals (current goals can now be found in the care plan section) Acute Rehab PT Goals Patient Stated Goal: to breathe better. to not need O2 Progress towards PT goals: Progressing toward goals    Frequency    Min 3X/week      PT Plan Current plan remains appropriate    Co-evaluation              AM-PAC PT "6 Clicks" Mobility   Outcome Measure  Help needed turning from your back to your side while in a flat bed without using bedrails?: A Little Help needed moving from lying on your back to sitting on the side of a flat bed without using  bedrails?: A Little Help needed moving to and from a bed to a chair (including a wheelchair)?: A Little Help needed standing up from a chair using your arms (e.g., wheelchair or bedside chair)?: A Little Help needed to walk in hospital room?: A Little Help needed climbing 3-5 steps with a railing? : A Little 6 Click Score: 18    End of Session Equipment Utilized During Treatment: Oxygen;Gait belt Activity Tolerance: Patient limited by fatigue (limited by dyspnea) Patient left: in chair;with call bell/phone within reach;with chair alarm set   PT Visit Diagnosis: Muscle weakness (generalized) (M62.81);Unsteadiness on feet (R26.81);Difficulty in walking, not elsewhere classified (R26.2);History of falling (Z91.81)     Time: 2563-8937 PT Time Calculation (min) (ACUTE ONLY): 18 min  Charges:  $Gait Training: 8-22 mins                         Faye Ramsay,  PT Acute Rehabilitation  Office: [phone] Pager: [phone]

## 2020-12-18 NOTE — Progress Notes (Signed)
Subjective: 2 Days Post-Op Procedure(s) (LRB): REVERSE SHOULDER ARTHROPLASTY (Right) Patient reports pain as 3 on 0-10 scale.   Denies CP or SOB.  Voiding without difficulty. Positive flatus. Feeling much better today. Objective: Vital signs in last 24 hours: Temp:  [97.6 F (36.4 C)-97.9 F (36.6 C)] 97.8 F (36.6 C) (02/13 0631) Pulse Rate:  [61-69] 64 (02/13 0700) Resp:  [20] 20 (02/12 2130) BP: (126-146)/(58-114) 137/61 (02/13 0700) SpO2:  [96 %-99 %] 97 % (02/13 0631) FiO2 (%):  [44 %] 44 % (02/12 1850)  Intake/Output from previous day: 02/12 0701 - 02/13 0700 In: 240 [P.O.:240] Out: 500 [Urine:500] Intake/Output this shift: No intake/output data recorded.  Recent Labs    12/16/20 1230 12/17/20 0554  HGB 10.5* 9.2*   Recent Labs    12/16/20 1230 12/17/20 0554  WBC 7.3  --   RBC 3.29*  --   HCT 32.9* 29.0*  PLT 275  --    Recent Labs    12/16/20 1230 12/17/20 0554  NA 138 140  K 3.9 3.9  CL 101 102  CO2 25 28  BUN 9 9  CREATININE 0.65 0.66  GLUCOSE 117* 144*  CALCIUM 8.6* 8.4*   No results for input(s): LABPT, INR in the last 72 hours.  Neurologically intact Sensation intact distally Incision: dressing C/D/I  Compartments are soft.  There is ecchymosis extending down to the elbow.  Still some residual numbness in the second and third digits.  Assessment/Plan:  2 Days Post-Op Procedure(s) (LRB): REVERSE SHOULDER ARTHROPLASTY (Right) Discharge home with home health if the patient is able to wean off her oxygen today.  Chest x-ray yesterday showed no acute infiltrate.  She is doing much better today.  2 saturations good. Patient wishes to go home significantly off of oxygen.  Power of attorney however indicates that her daughter lives with her.  Patient very well and she has some nursing skills.  Currently about home.  She did visit with last shoulder replacement.  I discussed this with nursing.   Active Problems:   S/P shoulder replacement,  right      Javier Docker 12/18/2020,

## 2020-12-19 DIAGNOSIS — I471 Supraventricular tachycardia: Secondary | ICD-10-CM | POA: Diagnosis present

## 2020-12-19 DIAGNOSIS — I495 Sick sinus syndrome: Secondary | ICD-10-CM | POA: Diagnosis present

## 2020-12-19 DIAGNOSIS — J45909 Unspecified asthma, uncomplicated: Secondary | ICD-10-CM | POA: Diagnosis present

## 2020-12-19 DIAGNOSIS — I10 Essential (primary) hypertension: Secondary | ICD-10-CM | POA: Diagnosis present

## 2020-12-19 DIAGNOSIS — Z87891 Personal history of nicotine dependence: Secondary | ICD-10-CM | POA: Diagnosis not present

## 2020-12-19 DIAGNOSIS — Z881 Allergy status to other antibiotic agents status: Secondary | ICD-10-CM | POA: Diagnosis not present

## 2020-12-19 DIAGNOSIS — Z950 Presence of cardiac pacemaker: Secondary | ICD-10-CM | POA: Diagnosis not present

## 2020-12-19 DIAGNOSIS — W19XXXA Unspecified fall, initial encounter: Secondary | ICD-10-CM | POA: Diagnosis present

## 2020-12-19 DIAGNOSIS — Z888 Allergy status to other drugs, medicaments and biological substances status: Secondary | ICD-10-CM | POA: Diagnosis not present

## 2020-12-19 DIAGNOSIS — K219 Gastro-esophageal reflux disease without esophagitis: Secondary | ICD-10-CM | POA: Diagnosis present

## 2020-12-19 DIAGNOSIS — Z885 Allergy status to narcotic agent status: Secondary | ICD-10-CM | POA: Diagnosis not present

## 2020-12-19 DIAGNOSIS — Z20822 Contact with and (suspected) exposure to covid-19: Secondary | ICD-10-CM | POA: Diagnosis present

## 2020-12-19 DIAGNOSIS — S42291A Other displaced fracture of upper end of right humerus, initial encounter for closed fracture: Secondary | ICD-10-CM | POA: Diagnosis present

## 2020-12-19 DIAGNOSIS — Z79899 Other long term (current) drug therapy: Secondary | ICD-10-CM | POA: Diagnosis not present

## 2020-12-19 DIAGNOSIS — E7800 Pure hypercholesterolemia, unspecified: Secondary | ICD-10-CM | POA: Diagnosis present

## 2020-12-19 NOTE — Progress Notes (Signed)
Orthopedics Progress Note  Subjective: Had some breathing issues yesterday but feeling better today  Objective:  Vitals:   12/19/20 0449 12/19/20 0452  BP: (!) 157/101 (!) 147/72  Pulse: 78 78  Resp: 16   Temp: 98.2 F (36.8 C)   SpO2: 94% 97%    General: Awake and alert  Musculoskeletal: Right shoulder dressing CDI Neurovascularly intact  Lab Results  Component Value Date   WBC 7.3 12/16/2020   HGB 9.2 (L) 12/17/2020   HCT 29.0 (L) 12/17/2020   MCV 100.0 12/16/2020   PLT 275 12/16/2020       Component Value Date/Time   NA 140 12/17/2020 0554   K 3.9 12/17/2020 0554   CL 102 12/17/2020 0554   CO2 28 12/17/2020 0554   GLUCOSE 144 (H) 12/17/2020 0554   BUN 9 12/17/2020 0554   CREATININE 0.66 12/17/2020 0554   CALCIUM 8.4 (L) 12/17/2020 0554   GFRNONAA >60 12/17/2020 0554    No results found for: INR, PROTIME  Assessment/Plan: POD #3 s/p Procedure(s): REVERSE SHOULDER ARTHROPLASTY Home today if able to wean off O2 Follow up in two weeks Almedia Balls. Ranell Patrick, MD 12/19/2020 7:23 AM

## 2020-12-19 NOTE — TOC Transition Note (Incomplete)
Transition of Care [facility]) - CM/SW Discharge Note   Patient Details  Name: Holly Sherman MRN: [id number] Date of Birth: Mar 28, 1941  Transition of Care [facility]) CM/SW Contact:  Darleene Cleaver, LCSW Phone Number: 12/19/2020, 1:10 PM   Clinical Narrative:     Patient will be going home with home health PT through  .  CSW signing off please reconsult with any other social work needs, home health agency has been notified of planned discharge.    Final next level of care: Home w Home Health Services Barriers to Discharge: Barriers Resolved   Patient Goals and CMS Choice Patient states their goals for this hospitalization and ongoing recovery are:: To return back home with home health PT. CMS Medicare.gov Compare Post Acute Care list provided to:: Patient Choice offered to / list presented to : Patient  Discharge Placement                       Discharge Plan and Services                          HH Arranged: PT          Social Determinants of Health (SDOH) Interventions     Readmission Risk Interventions No flowsheet data found.

## 2020-12-19 NOTE — Progress Notes (Signed)
PT Cancellation Note  Patient Details Name: Ebonie Westerlund MRN: [id number] DOB: September 08, 1941   Cancelled Treatment:    Reason Eval/Treat Not Completed:  Attempted PT tx session-pt reported she walked already and that she is going home.    Faye Ramsay, PT Acute Rehabilitation  Office: [phone] Pager: [phone]

## 2020-12-19 NOTE — Progress Notes (Signed)
SATURATION QUALIFICATIONS: (This note is used to comply with regulatory documentation for home oxygen)  Patient Saturations on Room Air at Rest = 91%  Patient Saturations on Room Air while Ambulating = 90%    Please briefly explain why patient needs home oxygen: Does not qualify

## 2020-12-21 ENCOUNTER — Encounter (HOSPITAL_COMMUNITY): Payer: Self-pay | Admitting: Orthopedic Surgery

## 2022-01-03 ENCOUNTER — Other Ambulatory Visit: Payer: Self-pay | Admitting: Orthopedic Surgery

## 2022-01-03 DIAGNOSIS — M25521 Pain in right elbow: Secondary | ICD-10-CM

## 2022-01-04 ENCOUNTER — Other Ambulatory Visit: Payer: Self-pay | Admitting: Orthopedic Surgery

## 2022-01-04 ENCOUNTER — Ambulatory Visit
Admission: RE | Admit: 2022-01-04 | Discharge: 2022-01-04 | Disposition: A | Discharge status: Home / Self Care | Payer: Medicare HMO | Source: Ambulatory Visit | Attending: Orthopedic Surgery | Admitting: Orthopedic Surgery

## 2022-01-04 DIAGNOSIS — M25521 Pain in right elbow: Secondary | ICD-10-CM

## 2023-08-25 IMAGING — CT CT ELBOW*R* W/O CM
4 of 6 series · 14 of 36 positions shown, 16 images · non-contrast
Comparison: None.

CLINICAL DATA: Right elbow pain since fall last [REDACTED].

EXAM:
CT OF THE UPPER RIGHT EXTREMITY WITHOUT CONTRAST
TECHNIQUE: Multidetector CT imaging of the upper right extremity was performed
according to the standard protocol. 3-dimensional CT images were
rendered by post-processing of the original CT data on an
acquisition workstation. The 3-dimensional CT images were
interpreted and findings were reported in the accompanying complete
CT report for this study.
RADIATION DOSE REDUCTION: This exam was performed according to the
departmental dose-optimization program which includes automated
exposure control, adjustment of the mA and/or kV according to
patient size and/or use of iterative reconstruction technique.

[Series 4: elbow 1.50 br60 s3 axial bone hd fov · axial · 0.22mm/px · z∈[-1219,-1130]mm · 5 of 170 slices shown, 7 images]
[im 29/170  soft-tissue]
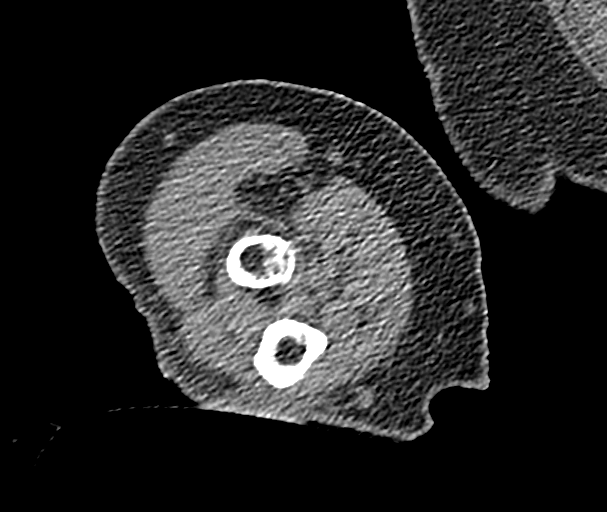
[im 29/170  bone]
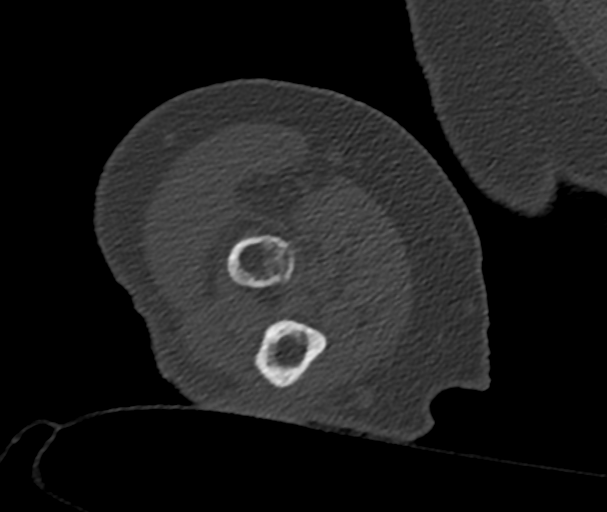
[im 57/170  bone]
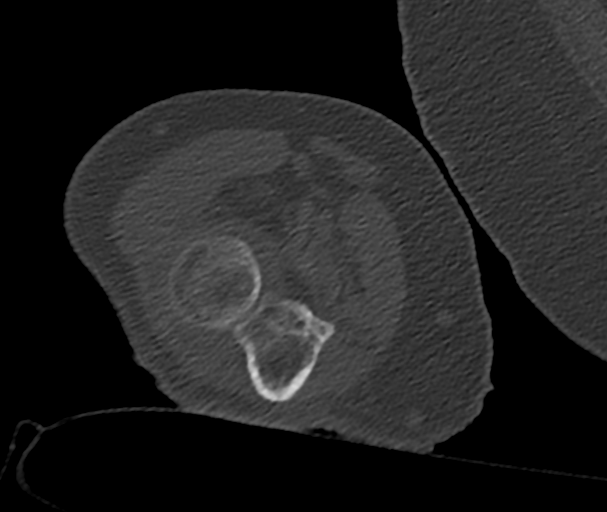
[im 85/170  bone]
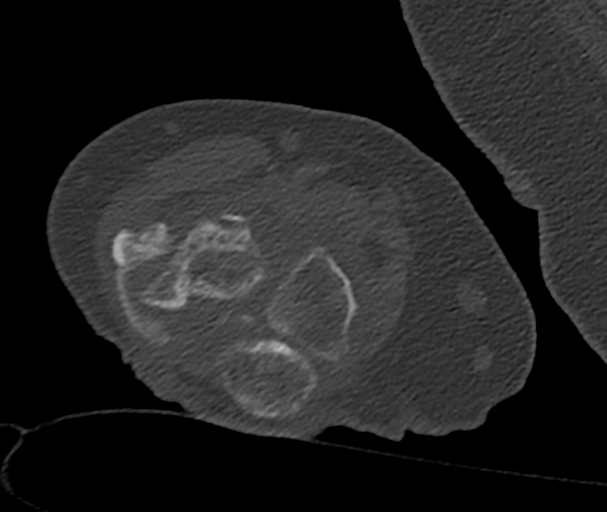
[im 113/170  bone]
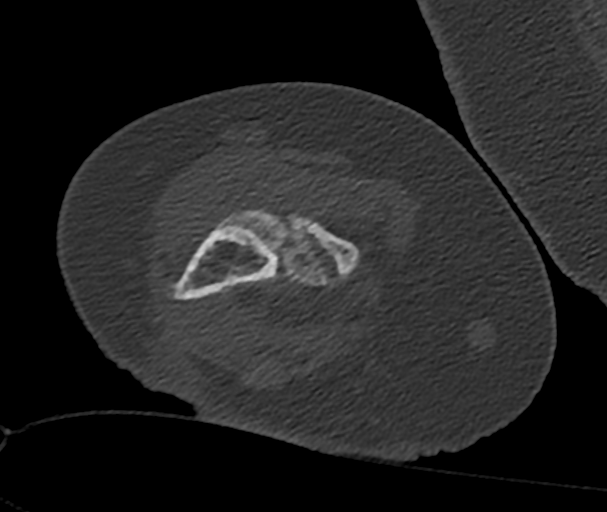
[im 141/170  soft-tissue]
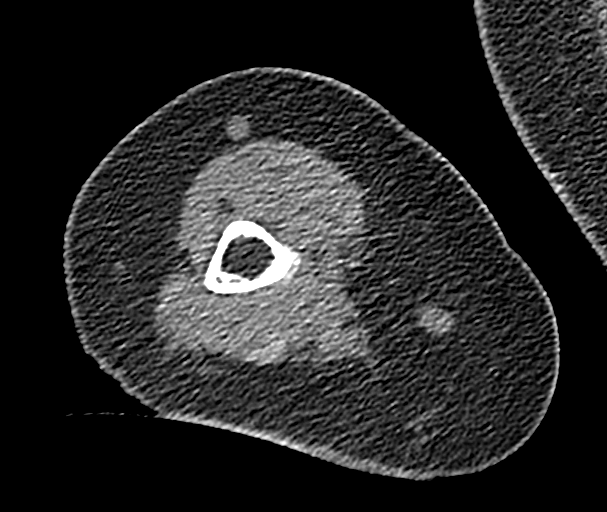
[im 141/170  bone]
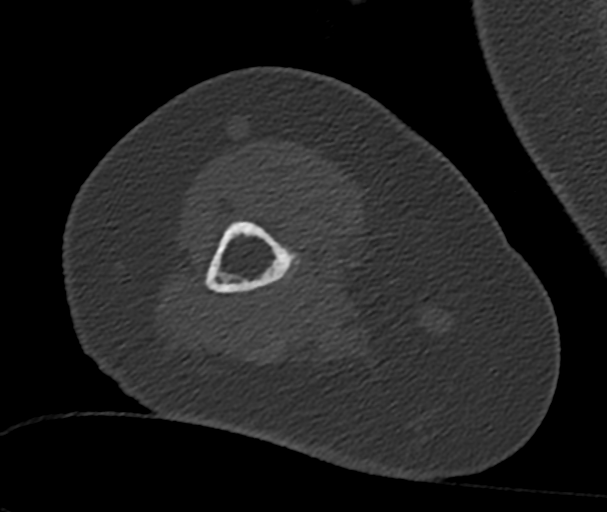

[Series 6: elbow 1.50 br40 s3 axial st hd fov · axial · 0.22mm/px · z∈[-1219,-1197]mm · 2 of 170 slices shown]
[im 29/170  bone]
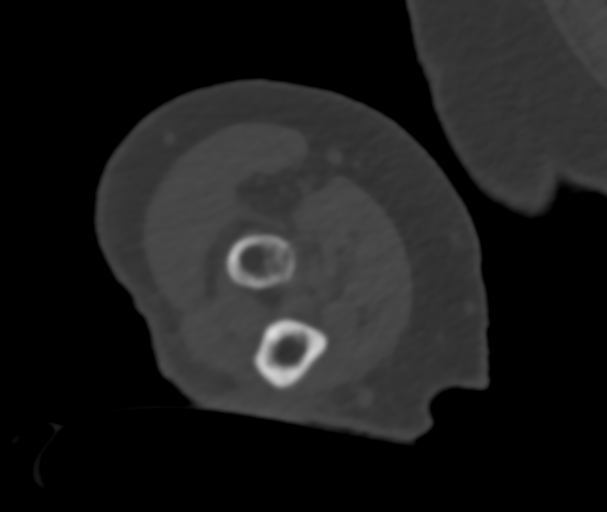
[im 57/170  bone]
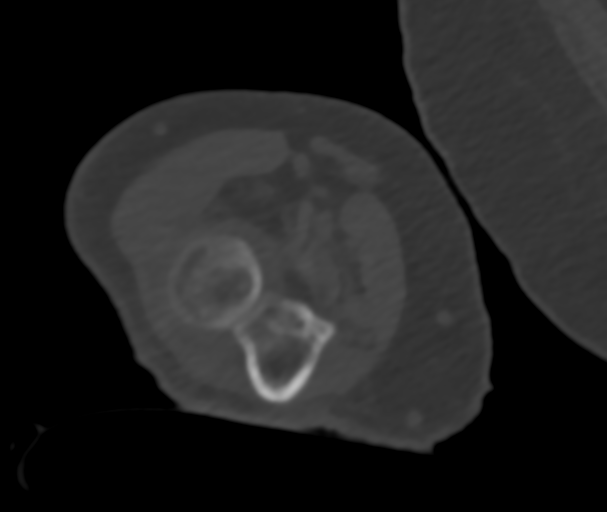

[Series 8: elbow 1.50 hr60 s3 cor bone hd fov · coronal · 0.26mm/px · 1 of 136 slices shown]
[im 68/136  bone]
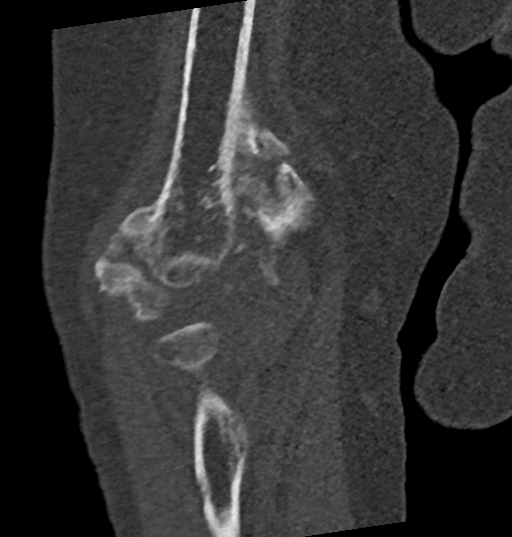

[Series 12: elbow 1.50 br60 s3 sag bone hd fov · sagittal · 0.22mm/px · 6 of 162 slices shown]
[im 27/162  bone]
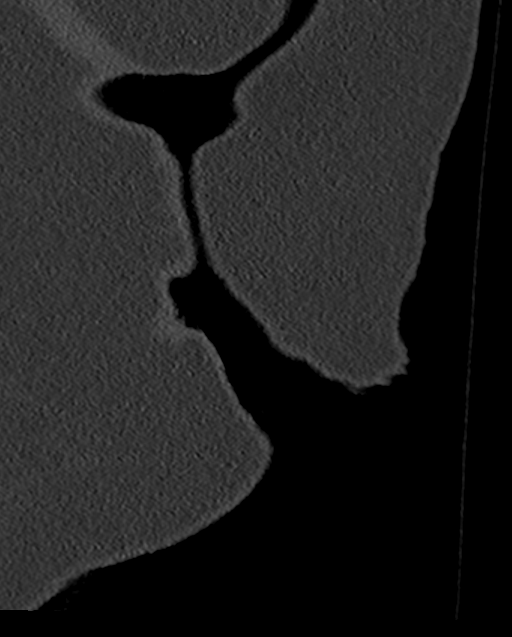
[im 40/162  soft-tissue]
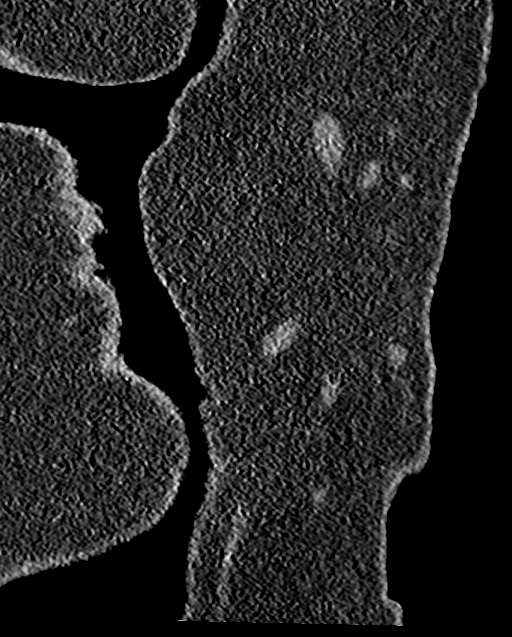
[im 54/162  bone]
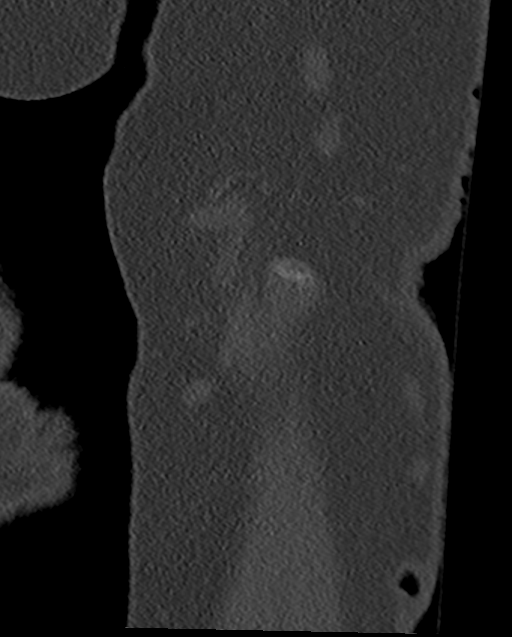
[im 81/162  bone]
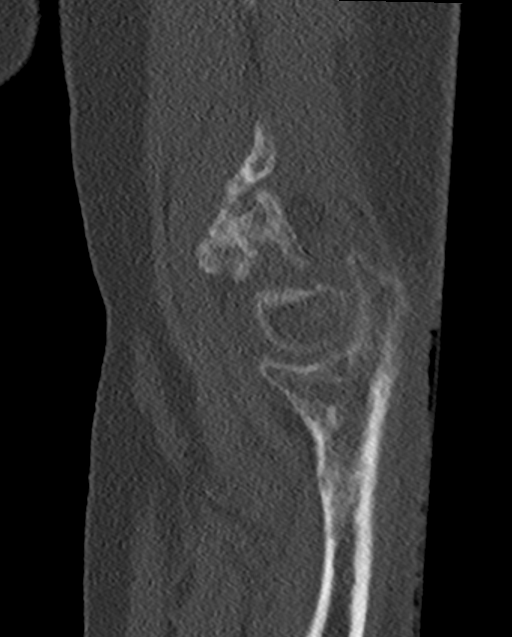
[im 108/162  bone]
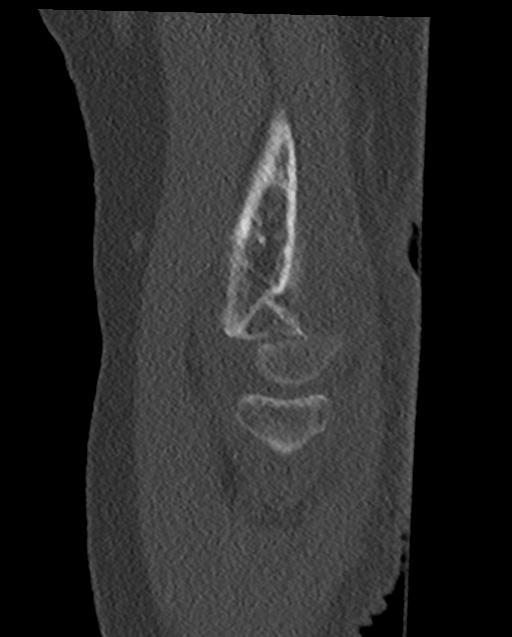
[im 135/162  bone]
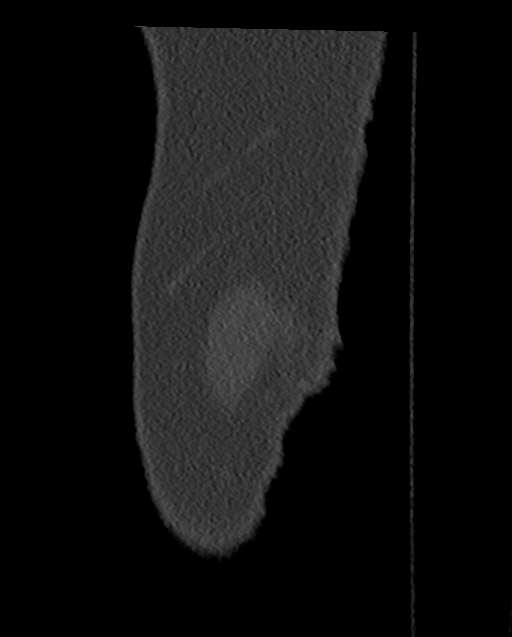

[14 of 36 positions shown; findings below may reference images not displayed]

FINDINGS: Bones/Joint/Cartilage

Chronic appearing highly comminuted and impacted fracture of the
distal humerus with some bridging bone proximally, but the
capitellum/lateral epicondyle and trochlea/medial epicondyle
fragments remain largely nonunited. The central trochlea and
capitellum remain distracted approximally 7 mm. No dislocation. Mild
elbow joint space narrowing with small marginal osteophytes.
Moderate joint effusion.

Ligaments

Ligaments are suboptimally evaluated by CT.

Muscles and Tendons
Grossly intact.

Soft tissue
No fluid collection or hematoma.  No soft tissue mass.
IMPRESSION: 1. Chronic largely nonunited comminuted and impacted bicondylar
distal humerus fracture.
2. Mild elbow osteoarthritis with moderate joint effusion.
